# Patient Record
Sex: Female | Born: 1980 | Race: White | Hispanic: No | Marital: Single | Smoking: Current every day smoker
Health system: Southern US, Community
[De-identification: ages and names within clinical notes are randomized; demographics above are authoritative.]

## PROBLEM LIST (undated history)

## (undated) DIAGNOSIS — T7840XA Allergy, unspecified, initial encounter: Secondary | ICD-10-CM

## (undated) HISTORY — DX: Allergy, unspecified, initial encounter: T78.40XA

---
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
## 2005-04-21 ENCOUNTER — Inpatient Hospital Stay (HOSPITAL_COMMUNITY): Admission: EM | Admit: 2005-04-21 | Discharge: 2005-04-23 | Payer: Self-pay | Admitting: Psychiatry

## 2005-04-21 ENCOUNTER — Ambulatory Visit: Payer: Self-pay | Admitting: Psychiatry

## 2005-04-26 ENCOUNTER — Emergency Department (HOSPITAL_COMMUNITY): Admission: EM | Admit: 2005-04-26 | Discharge: 2005-04-26 | Payer: Self-pay | Admitting: Emergency Medicine

## 2005-07-23 ENCOUNTER — Emergency Department (HOSPITAL_COMMUNITY): Admission: EM | Admit: 2005-07-23 | Discharge: 2005-07-23 | Payer: Self-pay | Admitting: Emergency Medicine

## 2005-12-18 ENCOUNTER — Emergency Department (HOSPITAL_COMMUNITY): Admission: EM | Admit: 2005-12-18 | Discharge: 2005-12-18 | Payer: Self-pay | Admitting: Emergency Medicine

## 2005-12-23 ENCOUNTER — Emergency Department (HOSPITAL_COMMUNITY): Admission: EM | Admit: 2005-12-23 | Discharge: 2005-12-23 | Payer: Self-pay | Admitting: Emergency Medicine

## 2015-11-13 ENCOUNTER — Ambulatory Visit (INDEPENDENT_AMBULATORY_CARE_PROVIDER_SITE_OTHER): Payer: BLUE CROSS/BLUE SHIELD | Admitting: Physician Assistant

## 2015-11-13 VITALS — BP 116/82 | HR 112 | Temp 98.1°F | Resp 16 | Ht 68.0 in | Wt 169.2 lb

## 2015-11-13 DIAGNOSIS — M7582 Other shoulder lesions, left shoulder: Secondary | ICD-10-CM | POA: Diagnosis not present

## 2015-11-13 DIAGNOSIS — M25512 Pain in left shoulder: Secondary | ICD-10-CM

## 2015-11-13 MED ORDER — NAPROXEN 500 MG PO TABS: 500.0000 mg | ORAL_TABLET | Freq: Two times a day (BID) | ORAL | Status: DC

## 2015-11-13 NOTE — Progress Notes (Signed)
   11/14/2015 8:56 PM   DOB: 02/27/1981 / MRN: [id number]  SUBJECTIVE:  Janese BanksCarly J Jessee is a 35 y.o. female presenting for left shoulder pain.  Reports the pain has been present for two years now but has suddenly worsened over the last two days.  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 Reports most of her pain occurs with the first 15 degrees of abduction, and she pain free throughout the remainder of abduction.  She denies fever, chest pain, SOB, DOE, nausea.   She has No Known Allergies.   She  has a past medical history of Allergy.    She  reports that she has been smoking.  She does not have any smokeless tobacco history on file. She reports that she does not drink alcohol or use illicit drugs. She  has no sexual activity history on file. The patient  has no past surgical history on file.  Her family history is not on file.  ROS  Per HPI.   Problem list and medications reviewed and updated by myself where necessary, and exist elsewhere in the encounter.   OBJECTIVE:  BP 116/82 mmHg  Pulse 112  Temp(Src) 98.1 F (36.7 C) (Oral)  Resp 16  Ht 5\' 8"  (1.727 m)  Wt 169 lb 3.2 oz (76.749 kg)  BMI 25.73 kg/m2  SpO2 98%  LMP 11/01/2015  Physical Exam  Constitutional: She is oriented to person, place, and time. She appears well-developed.  Cardiovascular: Regular rhythm, normal heart sounds and intact distal pulses.  Exam reveals no gallop and no friction rub.   No murmur heard. Pulmonary/Chest: Effort normal and breath sounds normal. No respiratory distress. She has no wheezes. She has no rales. She exhibits no tenderness.  Musculoskeletal:       Right shoulder: Normal.       Left shoulder: She exhibits tenderness (along the teres minor), crepitus and pain. She exhibits normal range of motion, no bony tenderness, no swelling, no effusion, no deformity, no spasm and normal strength.       Arms: Neurological: She is alert and oriented to  person, place, and time.  Skin: Skin is warm and dry. She is not diaphoretic.    No results found for this or any previous visit (from the past 72 hour(s)).  No results found.  ASSESSMENT AND PLAN  Nyomie was seen today for shoulder pain and flu vaccine.  Diagnoses and all orders for this visit:  Left shoulder pain: She has the perfect presentation for impingement given her exam and the nature of her work.  Advised she wear a sling at work and have given a note outlining this.  Will get her in with orthopedics. Starting an NSAID.   -     Sling immobilizer -     naproxen (NAPROSYN) 500 MG tablet; Take 1 tablet (500 mg total) by mouth 2 (two) times daily with a meal.  Rotator cuff tendinitis, left -     Ambulatory referral to Orthopedic Surgery    The patient was advised to call or return to clinic if she does not see an improvement in symptoms or to seek the care of the closest emergency department if she worsens with the above plan.   Deliah BostonMichael Clark, MHS, PA-C [facility] 11/14/2015 8:56 PM

## 2015-11-13 NOTE — Patient Instructions (Signed)
     IF you received an x-ray today, you will receive an invoice from [facility]. Please contact [facility] at [phone] with questions or concerns regarding your invoice.   IF you received labwork today, you will receive an invoice from [facility]. Please contact [facility] at [phone] with questions or concerns regarding your invoice.   Our billing staff will not be able to assist you with questions regarding bills from these companies.  You will be contacted with the lab results as soon as they are available. The fastest way to get your results is to activate your My Chart account. Instructions are located on the last page of this paperwork. If you have not heard from us regarding the results in 2 weeks, please contact this office.      

## 2019-04-07 ENCOUNTER — Emergency Department (HOSPITAL_COMMUNITY): Payer: BLUE CROSS/BLUE SHIELD

## 2019-04-07 ENCOUNTER — Inpatient Hospital Stay (HOSPITAL_COMMUNITY)
Admission: EM | Admit: 2019-04-07 | Discharge: 2019-04-09 | DRG: 100 | Disposition: A | Discharge status: Home / Self Care | Payer: BLUE CROSS/BLUE SHIELD | Attending: Internal Medicine | Admitting: Internal Medicine

## 2019-04-07 ENCOUNTER — Observation Stay (HOSPITAL_COMMUNITY): Payer: BLUE CROSS/BLUE SHIELD

## 2019-04-07 ENCOUNTER — Other Ambulatory Visit: Payer: Self-pay

## 2019-04-07 DIAGNOSIS — F909 Attention-deficit hyperactivity disorder, unspecified type: Secondary | ICD-10-CM | POA: Diagnosis present

## 2019-04-07 DIAGNOSIS — F411 Generalized anxiety disorder: Secondary | ICD-10-CM | POA: Diagnosis present

## 2019-04-07 DIAGNOSIS — Z716 Tobacco abuse counseling: Secondary | ICD-10-CM

## 2019-04-07 DIAGNOSIS — S022XXA Fracture of nasal bones, initial encounter for closed fracture: Secondary | ICD-10-CM | POA: Diagnosis present

## 2019-04-07 DIAGNOSIS — F17210 Nicotine dependence, cigarettes, uncomplicated: Secondary | ICD-10-CM | POA: Diagnosis present

## 2019-04-07 DIAGNOSIS — G40509 Epileptic seizures related to external causes, not intractable, without status epilepticus: Secondary | ICD-10-CM | POA: Diagnosis not present

## 2019-04-07 DIAGNOSIS — G40909 Epilepsy, unspecified, not intractable, without status epilepticus: Secondary | ICD-10-CM

## 2019-04-07 DIAGNOSIS — Z79899 Other long term (current) drug therapy: Secondary | ICD-10-CM

## 2019-04-07 DIAGNOSIS — Y92511 Restaurant or cafe as the place of occurrence of the external cause: Secondary | ICD-10-CM

## 2019-04-07 DIAGNOSIS — G9341 Metabolic encephalopathy: Secondary | ICD-10-CM | POA: Diagnosis present

## 2019-04-07 DIAGNOSIS — Y990 Civilian activity done for income or pay: Secondary | ICD-10-CM

## 2019-04-07 DIAGNOSIS — F10188 Alcohol abuse with other alcohol-induced disorder: Secondary | ICD-10-CM | POA: Diagnosis present

## 2019-04-07 DIAGNOSIS — R030 Elevated blood-pressure reading, without diagnosis of hypertension: Secondary | ICD-10-CM | POA: Diagnosis present

## 2019-04-07 DIAGNOSIS — R569 Unspecified convulsions: Secondary | ICD-10-CM | POA: Diagnosis not present

## 2019-04-07 DIAGNOSIS — E871 Hypo-osmolality and hyponatremia: Secondary | ICD-10-CM | POA: Diagnosis present

## 2019-04-07 DIAGNOSIS — W1830XA Fall on same level, unspecified, initial encounter: Secondary | ICD-10-CM | POA: Diagnosis present

## 2019-04-07 DIAGNOSIS — Z20828 Contact with and (suspected) exposure to other viral communicable diseases: Secondary | ICD-10-CM | POA: Diagnosis present

## 2019-04-07 DIAGNOSIS — F329 Major depressive disorder, single episode, unspecified: Secondary | ICD-10-CM | POA: Diagnosis present

## 2019-04-07 LAB — CBC WITH DIFFERENTIAL/PLATELET
Abs Immature Granulocytes: 0.04 10*3/uL (ref 0.00–0.07)
Basophils Absolute: 0.1 10*3/uL (ref 0.0–0.1)
Basophils Relative: 1 %
Eosinophils Absolute: 0.2 10*3/uL (ref 0.0–0.5)
Eosinophils Relative: 2 %
HCT: 42.5 % (ref 36.0–46.0)
Hemoglobin: 14.7 g/dL (ref 12.0–15.0)
Immature Granulocytes: 1 %
Lymphocytes Relative: 18 %
Lymphs Abs: 1.4 10*3/uL (ref 0.7–4.0)
MCH: 32.2 pg (ref 26.0–34.0)
MCHC: 34.6 g/dL (ref 30.0–36.0)
MCV: 93 fL (ref 80.0–100.0)
Monocytes Absolute: 0.5 10*3/uL (ref 0.1–1.0)
Monocytes Relative: 7 %
Neutro Abs: 5.7 10*3/uL (ref 1.7–7.7)
Neutrophils Relative %: 71 %
Platelets: 177 10*3/uL (ref 150–400)
RBC: 4.57 MIL/uL (ref 3.87–5.11)
RDW: 12.7 % (ref 11.5–15.5)
WBC: 7.8 10*3/uL (ref 4.0–10.5)
nRBC: 0 % (ref 0.0–0.2)

## 2019-04-07 LAB — BASIC METABOLIC PANEL
Anion gap: 14 (ref 5–15)
BUN: 5 mg/dL — ABNORMAL LOW (ref 6–20)
CO2: 21 mmol/L — ABNORMAL LOW (ref 22–32)
Calcium: 9.2 mg/dL (ref 8.9–10.3)
Chloride: 99 mmol/L (ref 98–111)
Creatinine, Ser: 0.67 mg/dL (ref 0.44–1.00)
GFR calc Af Amer: >60 mL/min (ref >60)
GFR calc non Af Amer: >60 mL/min (ref >60)
Glucose, Bld: 127 mg/dL — ABNORMAL HIGH (ref 70–99)
Potassium: 3.5 mmol/L (ref 3.5–5.1)
Sodium: 134 mmol/L — ABNORMAL LOW (ref 135–145)

## 2019-04-07 LAB — I-STAT BETA HCG BLOOD, ED (MC, WL, AP ONLY): I-stat hCG, quantitative: <5 m[IU]/mL (ref <5)

## 2019-04-07 LAB — MAGNESIUM: Magnesium: 1.7 mg/dL (ref 1.7–2.4)

## 2019-04-07 MED ORDER — ACETAMINOPHEN 325 MG PO TABS
650.0000 mg | ORAL_TABLET | Freq: Four times a day (QID) | ORAL | Status: DC | PRN
  Administered 2019-04-07 – 2019-04-08 (×3): 650 mg via ORAL
  Filled 2019-04-07 (×3): qty 2

## 2019-04-07 MED ORDER — LEVETIRACETAM 500 MG PO TABS: 500.0000 mg | ORAL_TABLET | Freq: Two times a day (BID) | ORAL | Status: DC

## 2019-04-07 MED ORDER — NICOTINE 21 MG/24HR TD PT24
21.0000 mg | MEDICATED_PATCH | Freq: Every day | TRANSDERMAL | Status: DC
  Filled 2019-04-07: qty 1

## 2019-04-07 MED ORDER — LEVETIRACETAM IN NACL 1000 MG/100ML IV SOLN
1000.0000 mg | Freq: Once | INTRAVENOUS | Status: AC
  Administered 2019-04-07: 20:00:00 1000 mg via INTRAVENOUS
  Filled 2019-04-07: qty 100

## 2019-04-08 ENCOUNTER — Inpatient Hospital Stay (HOSPITAL_COMMUNITY): Payer: BLUE CROSS/BLUE SHIELD

## 2019-04-08 ENCOUNTER — Observation Stay (HOSPITAL_COMMUNITY): Payer: BLUE CROSS/BLUE SHIELD

## 2019-04-08 DIAGNOSIS — R030 Elevated blood-pressure reading, without diagnosis of hypertension: Secondary | ICD-10-CM | POA: Diagnosis present

## 2019-04-08 DIAGNOSIS — W1830XA Fall on same level, unspecified, initial encounter: Secondary | ICD-10-CM | POA: Diagnosis present

## 2019-04-08 DIAGNOSIS — S022XXA Fracture of nasal bones, initial encounter for closed fracture: Secondary | ICD-10-CM | POA: Diagnosis present

## 2019-04-08 DIAGNOSIS — G9341 Metabolic encephalopathy: Secondary | ICD-10-CM | POA: Diagnosis present

## 2019-04-08 DIAGNOSIS — F329 Major depressive disorder, single episode, unspecified: Secondary | ICD-10-CM | POA: Diagnosis present

## 2019-04-08 DIAGNOSIS — F17210 Nicotine dependence, cigarettes, uncomplicated: Secondary | ICD-10-CM | POA: Diagnosis present

## 2019-04-08 DIAGNOSIS — G40509 Epileptic seizures related to external causes, not intractable, without status epilepticus: Secondary | ICD-10-CM | POA: Diagnosis present

## 2019-04-08 DIAGNOSIS — R569 Unspecified convulsions: Secondary | ICD-10-CM | POA: Diagnosis present

## 2019-04-08 DIAGNOSIS — Z79899 Other long term (current) drug therapy: Secondary | ICD-10-CM | POA: Diagnosis not present

## 2019-04-08 DIAGNOSIS — Y990 Civilian activity done for income or pay: Secondary | ICD-10-CM | POA: Diagnosis not present

## 2019-04-08 DIAGNOSIS — F1010 Alcohol abuse, uncomplicated: Secondary | ICD-10-CM | POA: Diagnosis not present

## 2019-04-08 DIAGNOSIS — F10188 Alcohol abuse with other alcohol-induced disorder: Secondary | ICD-10-CM | POA: Diagnosis present

## 2019-04-08 DIAGNOSIS — Y92511 Restaurant or cafe as the place of occurrence of the external cause: Secondary | ICD-10-CM | POA: Diagnosis not present

## 2019-04-08 DIAGNOSIS — Z716 Tobacco abuse counseling: Secondary | ICD-10-CM | POA: Diagnosis not present

## 2019-04-08 DIAGNOSIS — Z20828 Contact with and (suspected) exposure to other viral communicable diseases: Secondary | ICD-10-CM | POA: Diagnosis present

## 2019-04-08 DIAGNOSIS — F909 Attention-deficit hyperactivity disorder, unspecified type: Secondary | ICD-10-CM | POA: Diagnosis present

## 2019-04-08 DIAGNOSIS — G40909 Epilepsy, unspecified, not intractable, without status epilepticus: Secondary | ICD-10-CM | POA: Diagnosis not present

## 2019-04-08 DIAGNOSIS — F411 Generalized anxiety disorder: Secondary | ICD-10-CM | POA: Diagnosis present

## 2019-04-08 DIAGNOSIS — E871 Hypo-osmolality and hyponatremia: Secondary | ICD-10-CM | POA: Diagnosis present

## 2019-04-08 LAB — COMPREHENSIVE METABOLIC PANEL WITH GFR
ALT: 49 U/L — ABNORMAL HIGH (ref 0–44)
AST: 54 U/L — ABNORMAL HIGH (ref 15–41)
Albumin: 3.9 g/dL (ref 3.5–5.0)
Alkaline Phosphatase: 62 U/L (ref 38–126)
Anion gap: 13 (ref 5–15)
BUN: 5 mg/dL — ABNORMAL LOW (ref 6–20)
CO2: 23 mmol/L (ref 22–32)
Calcium: 9.1 mg/dL (ref 8.9–10.3)
Chloride: 101 mmol/L (ref 98–111)
Creatinine, Ser: 0.53 mg/dL (ref 0.44–1.00)
GFR calc Af Amer: >60 mL/min
GFR calc non Af Amer: >60 mL/min
Glucose, Bld: 102 mg/dL — ABNORMAL HIGH (ref 70–99)
Potassium: 3.2 mmol/L — ABNORMAL LOW (ref 3.5–5.1)
Sodium: 137 mmol/L (ref 135–145)
Total Bilirubin: 0.6 mg/dL (ref 0.3–1.2)
Total Protein: 7.4 g/dL (ref 6.5–8.1)

## 2019-04-08 LAB — SARS CORONAVIRUS 2 BY RT PCR (HOSPITAL ORDER, PERFORMED IN ~~LOC~~ HOSPITAL LAB): SARS Coronavirus 2: NEGATIVE

## 2019-04-08 LAB — CBC
HCT: 41 % (ref 36.0–46.0)
Hemoglobin: 13.8 g/dL (ref 12.0–15.0)
MCH: 31.9 pg (ref 26.0–34.0)
MCHC: 33.7 g/dL (ref 30.0–36.0)
MCV: 94.7 fL (ref 80.0–100.0)
Platelets: 188 10*3/uL (ref 150–400)
RBC: 4.33 MIL/uL (ref 3.87–5.11)
RDW: 12.8 % (ref 11.5–15.5)
WBC: 9.7 10*3/uL (ref 4.0–10.5)
nRBC: 0 % (ref 0.0–0.2)

## 2019-04-08 LAB — HIV ANTIBODY (ROUTINE TESTING W REFLEX): HIV Screen 4th Generation wRfx: NONREACTIVE

## 2019-04-08 MED ORDER — ONDANSETRON HCL 4 MG PO TABS: 4.0000 mg | ORAL_TABLET | Freq: Four times a day (QID) | ORAL | Status: DC | PRN

## 2019-04-08 MED ORDER — ONDANSETRON HCL 4 MG/2ML IJ SOLN: 4.0000 mg | Freq: Four times a day (QID) | INTRAMUSCULAR | Status: DC | PRN

## 2019-04-08 MED ORDER — VITAMIN B-1 100 MG PO TABS
100.0000 mg | ORAL_TABLET | Freq: Every day | ORAL | Status: DC
  Administered 2019-04-08 – 2019-04-09 (×2): 100 mg via ORAL
  Filled 2019-04-08 (×2): qty 1

## 2019-04-08 MED ORDER — ALPRAZOLAM 0.5 MG PO TABS
1.0000 mg | ORAL_TABLET | Freq: Three times a day (TID) | ORAL | Status: DC | PRN
  Administered 2019-04-09: 02:00:00 1 mg via ORAL
  Filled 2019-04-08: qty 2

## 2019-04-08 MED ORDER — LORAZEPAM 2 MG/ML IJ SOLN
2.0000 mg | Freq: Four times a day (QID) | INTRAMUSCULAR | Status: DC
  Administered 2019-04-08 (×2): 2 mg via INTRAVENOUS
  Filled 2019-04-08 (×2): qty 1

## 2019-04-08 MED ORDER — TAB-A-VITE/IRON PO TABS
1.0000 | ORAL_TABLET | Freq: Every day | ORAL | Status: DC
  Administered 2019-04-08 – 2019-04-09 (×2): 1 via ORAL
  Filled 2019-04-08 (×3): qty 1

## 2019-04-08 MED ORDER — DEXTROSE IN LACTATED RINGERS 5 % IV SOLN
INTRAVENOUS | Status: DC
  Administered 2019-04-08: 15:00:00 via INTRAVENOUS

## 2019-04-08 MED ORDER — GADOBUTROL 1 MMOL/ML IV SOLN
7.5000 mL | Freq: Once | INTRAVENOUS | Status: AC | PRN
  Administered 2019-04-08: 01:00:00 7.5 mL via INTRAVENOUS

## 2019-04-08 MED ORDER — LORAZEPAM 1 MG PO TABS
1.0000 mg | ORAL_TABLET | ORAL | Status: DC | PRN
  Administered 2019-04-08: 1 mg via ORAL
  Filled 2019-04-08: qty 1

## 2019-04-08 MED ORDER — SODIUM CHLORIDE 0.9 % IV SOLN
INTRAVENOUS | Status: DC
  Administered 2019-04-08: 02:00:00 via INTRAVENOUS

## 2019-04-08 MED ORDER — SERTRALINE HCL 100 MG PO TABS
100.0000 mg | ORAL_TABLET | Freq: Every day | ORAL | Status: DC
  Administered 2019-04-08 – 2019-04-09 (×2): 100 mg via ORAL
  Filled 2019-04-08 (×2): qty 1

## 2019-04-08 NOTE — Progress Notes (Signed)
Received patient from ED, AOX4, ambulatory, VSS but slightly elevated, pain at 3/10 d/t headache, oriented to room, bed controls, cal light, placed on telemetry monitor box 6N01, seizure precaution, SCDs and explained plan of care.  NT gave ice water to drink, pt now resting on bed watching TV.  Will monitor.

## 2019-04-08 NOTE — Progress Notes (Addendum)
NEURO HOSPITALIST PROGRESS NOTE   Subjective: Patient states that she feels better than yesterday. No reports of any further seizure activity. MRI was normal. Pending EEG.   Exam: Vitals:   04/08/19 0127 04/08/19 0547  BP: (!) 148/96 (!) 145/91  Pulse: 98 82  Resp: 16 18  Temp: 98.3 F (36.8 C) 98.4 F (36.9 C)  SpO2: 97% 98%    Physical Exam   HEENT-  Normocephalic, no lesions, without obvious abnormality.  Normal external eye and conjunctiva.   Lungs- no excessive working breathing.  Saturations within normal limits Extremities- Warm, dry and intact Musculoskeletal-no joint tenderness, deformity or swelling Skin-warm and dry, no hyperpigmentation, vitiligo, or suspicious lesions   Neuro:  Mental Status: Alert, oriented, thought content appropriate.  Speech fluent without evidence of aphasia.  Able to follow 3 step commands without difficulty. Cranial Nerves: II:  Visual fields grossly normal,  III,IV, VI: ptosis not present, extra-ocular motions intact bilaterally pupils equal, round, reactive to light and accommodation V,VII: smile symmetric, facial light touch sensation normal bilaterally VIII: hearing normal bilaterally IX,X: uvula rises symmetrically XI: bilateral shoulder shrug XII: midline tongue extension Motor: Right : Upper extremity   5/5    Left:     Upper extremity   5/5  Lower extremity   5/5     Lower extremity   5/5 Tone and bulk:normal tone throughout; no atrophy noted Sensory:  light touch intact throughout, bilaterally Deep Tendon Reflexes: 2+ right biceps biceps, patella 3+ left biceps and patella Plantars: Right: downgoing   Left: downgoing Cerebellar: normal finger-to-nose, normal rapid alternating movements and normal heel-to-shin test Gait: deferred    Medications:  Scheduled: . LORazepam  2 mg Intravenous Q6H  . nicotine  21 mg Transdermal Daily   Continuous: . sodium chloride 75 mL/hr at 04/08/19 7510     Pertinent Labs/Diagnostics:   Ct Head Wo Contrast  Result Date: 04/08/2019 CLINICAL DATA:  Seizure, fall, nose bleed. EXAM: CT HEAD WITHOUT CONTRAST CT MAXILLOFACIAL WITHOUT CONTRAST CT CERVICAL SPINE WITHOUT CONTRAST TECHNIQUE: Multidetector CT imaging of the head, cervical spine, and maxillofacial structures were performed using the standard protocol without intravenous contrast. Multiplanar CT image reconstructions of the cervical spine and maxillofacial structures were also generated. COMPARISON:  None. FINDINGS: CT HEAD FINDINGS Brain: Ventricles are normal in size and configuration. All areas of the brain demonstrate appropriate gray-white matter attenuation. There is no mass, hemorrhage, edema or other evidence of acute parenchymal abnormality. No extra-axial hemorrhage. Vascular: No hyperdense vessel or unexpected calcification. Skull: Normal. Negative for fracture or focal lesion. Other: None. CT MAXILLOFACIAL FINDINGS Osseous: Lower frontal bones are intact. Nondisplaced, to minimally displaced, fractures of the nasal bones bilaterally. Osseous structures about the orbits are intact and normally aligned bilaterally. Bilateral zygomatic arches and pterygoid plates are intact and normally aligned. Walls of the maxillary sinuses appear intact and normally aligned. No mandible fracture or displacement seen. Defect within the posterior LEFT mandibular molar, presumably a cavity. Orbits: Negative. No traumatic or inflammatory finding. Sinuses: Clear. Soft tissues: Soft tissue edema overlying the nasal bones. No circumscribed soft tissue hematoma identified. CT CERVICAL SPINE FINDINGS Alignment: Minimal scoliosis which may be accentuated by patient positioning. Slight reversal of the normal cervical spine lordosis is likely related to patient positioning or muscle spasm. No evidence of acute vertebral body subluxation. Skull base and vertebrae: No fracture line or displaced  fracture fragment seen.  Facet joints are normally aligned. Soft tissues and spinal canal: No prevertebral fluid or swelling. No visible canal hematoma. Disc levels: Mild disc desiccations within the mid and lower cervical spine. No significant central canal stenosis at any level. Upper chest: Negative. Other: None. IMPRESSION: 1. No acute intracranial abnormality. No intracranial mass, hemorrhage or edema. No skull fracture. 2. Nondisplaced, perhaps minimally displaced, fractures of the nasal bones bilaterally. Soft tissue edema overlying the nasal bones. No other facial bone fracture or dislocation seen. 3. No fracture or acute subluxation within the cervical spine. Mild degenerative change within the mid and lower cervical spine. 4. Defect within the posterior LEFT mandibular molar, presumably a large cavity. Electronically Signed   By: Bary RichardStan  Maynard M.D.   On: 02/12/2019 20:30   Ct Cervical Spine Wo Contrast  Result Date: 01/03/2019 CLINICAL DATA:  Seizure, fall, nose bleed. EXAM: CT HEAD WITHOUT CONTRAST CT MAXILLOFACIAL WITHOUT CONTRAST CT CERVICAL SPINE WITHOUT CONTRAST TECHNIQUE: Multidetector CT imaging of the head, cervical spine, and maxillofacial structures were performed using the standard protocol without intravenous contrast. Multiplanar CT image reconstructions of the cervical spine and maxillofacial structures were also generated. COMPARISON:  None. FINDINGS: CT HEAD FINDINGS Brain: Ventricles are normal in size and configuration. All areas of the brain demonstrate appropriate gray-white matter attenuation. There is no mass, hemorrhage, edema or other evidence of acute parenchymal abnormality. No extra-axial hemorrhage. Vascular: No hyperdense vessel or unexpected calcification. Skull: Normal. Negative for fracture or focal lesion. Other: None. CT MAXILLOFACIAL FINDINGS Osseous: Lower frontal bones are intact. Nondisplaced, to minimally displaced, fractures of the nasal bones bilaterally. Osseous structures about the  orbits are intact and normally aligned bilaterally. Bilateral zygomatic arches and pterygoid plates are intact and normally aligned. Walls of the maxillary sinuses appear intact and normally aligned. No mandible fracture or displacement seen. Defect within the posterior LEFT mandibular molar, presumably a cavity. Orbits: Negative. No traumatic or inflammatory finding. Sinuses: Clear. Soft tissues: Soft tissue edema overlying the nasal bones. No circumscribed soft tissue hematoma identified. CT CERVICAL SPINE FINDINGS Alignment: Minimal scoliosis which may be accentuated by patient positioning. Slight reversal of the normal cervical spine lordosis is likely related to patient positioning or muscle spasm. No evidence of acute vertebral body subluxation. Skull base and vertebrae: No fracture line or displaced fracture fragment seen. Facet joints are normally aligned. Soft tissues and spinal canal: No prevertebral fluid or swelling. No visible canal hematoma. Disc levels: Mild disc desiccations within the mid and lower cervical spine. No significant central canal stenosis at any level. Upper chest: Negative. Other: None. IMPRESSION: 1. No acute intracranial abnormality. No intracranial mass, hemorrhage or edema. No skull fracture. 2. Nondisplaced, perhaps minimally displaced, fractures of the nasal bones bilaterally. Soft tissue edema overlying the nasal bones. No other facial bone fracture or dislocation seen. 3. No fracture or acute subluxation within the cervical spine. Mild degenerative change within the mid and lower cervical spine. 4. Defect within the posterior LEFT mandibular molar, presumably a large cavity. Electronically Signed   By: Bary RichardStan  Maynard M.D.   On: 02/12/2019 20:30   Mr Laqueta JeanBrain W And Wo Contrast  Result Date: 04/08/2019 CLINICAL DATA:  Seizure.  Unequal pupils. EXAM: MRI HEAD WITHOUT AND WITH CONTRAST TECHNIQUE: Multiplanar, multiecho pulse sequences of the brain and surrounding structures were  obtained without and with intravenous contrast. CONTRAST:  7.5 mL Gadavist COMPARISON:  None. FINDINGS: BRAIN: There is no acute infarct, acute hemorrhage or extra-axial collection. The white  matter signal is normal for the patient's age. The cerebral and cerebellar volume are age-appropriate. There is no hydrocephalus. The midline structures are normal. The hippocampi are normal and symmetric in size and signal. The hypothalamus and mamillary bodies are normal. There is no cortical ectopia or dysplasia. No abnormal contrast enhancement. VASCULAR: Susceptibility-sensitive sequences show no chronic microhemorrhage or superficial siderosis. The major intracranial arterial and venous sinus flow voids are normal. SKULL AND UPPER CERVICAL SPINE: Calvarial bone marrow signal is normal. There is no skull base mass. The visualized upper cervical spine and soft tissues are normal. SINUSES/ORBITS: There are no fluid levels or advanced mucosal thickening. The mastoid air cells and middle ear cavities are free of fluid. The orbits are normal. IMPRESSION: Normal brain MRI. Electronically Signed   By: Deatra RobinsonKevin  Herman M.D.   On: 04/08/2019 01:27   Ct Maxillofacial Wo Cm  Result Date: 04/17/2019 CLINICAL DATA:  Seizure, fall, nose bleed. EXAM: CT HEAD WITHOUT CONTRAST CT MAXILLOFACIAL WITHOUT CONTRAST CT CERVICAL SPINE WITHOUT CONTRAST TECHNIQUE: Multidetector CT imaging of the head, cervical spine, and maxillofacial structures were performed using the standard protocol without intravenous contrast. Multiplanar CT image reconstructions of the cervical spine and maxillofacial structures were also generated. COMPARISON:  None. FINDINGS: CT HEAD FINDINGS Brain: Ventricles are normal in size and configuration. All areas of the brain demonstrate appropriate gray-white matter attenuation. There is no mass, hemorrhage, edema or other evidence of acute parenchymal abnormality. No extra-axial hemorrhage. Vascular: No hyperdense vessel or  unexpected calcification. Skull: Normal. Negative for fracture or focal lesion. Other: None. CT MAXILLOFACIAL FINDINGS Osseous: Lower frontal bones are intact. Nondisplaced, to minimally displaced, fractures of the nasal bones bilaterally. Osseous structures about the orbits are intact and normally aligned bilaterally. Bilateral zygomatic arches and pterygoid plates are intact and normally aligned. Walls of the maxillary sinuses appear intact and normally aligned. No mandible fracture or displacement seen. Defect within the posterior LEFT mandibular molar, presumably a cavity. Orbits: Negative. No traumatic or inflammatory finding. Sinuses: Clear. Soft tissues: Soft tissue edema overlying the nasal bones. No circumscribed soft tissue hematoma identified. CT CERVICAL SPINE FINDINGS Alignment: Minimal scoliosis which may be accentuated by patient positioning. Slight reversal of the normal cervical spine lordosis is likely related to patient positioning or muscle spasm. No evidence of acute vertebral body subluxation. Skull base and vertebrae: No fracture line or displaced fracture fragment seen. Facet joints are normally aligned. Soft tissues and spinal canal: No prevertebral fluid or swelling. No visible canal hematoma. Disc levels: Mild disc desiccations within the mid and lower cervical spine. No significant central canal stenosis at any level. Upper chest: Negative. Other: None. IMPRESSION: 1. No acute intracranial abnormality. No intracranial mass, hemorrhage or edema. No skull fracture. 2. Nondisplaced, perhaps minimally displaced, fractures of the nasal bones bilaterally. Soft tissue edema overlying the nasal bones. No other facial bone fracture or dislocation seen. 3. No fracture or acute subluxation within the cervical spine. Mild degenerative change within the mid and lower cervical spine. 4. Defect within the posterior LEFT mandibular molar, presumably a large cavity. Electronically Signed   By: Bary RichardStan   Maynard M.D.   On: 008/24/2020 20:30   Assessment:  38 year old female with seizures x 2. Overall presentation is most consistent with EtOH withdrawal seizure. MRI brain normal. EEG pending    Recommendations:  -- EEG --seizure precautions -- d/c adderrall. D/t this medication lowering the seizure threshold.  Outpatient seizure precautions: [place] [place] [facility] [place] statutes, patients with seizures are  not allowed to drive until  they have been seizure-free for six months. Use caution when using heavy equipment or power tools. Avoid working on ladders or at heights. Take showers instead of baths. Ensure the water temperature is not too high on the home water heater. Do not go swimming alone. When caring for infants or small children, sit down when holding, feeding, or changing them to minimize risk of injury to the child in the event you have a seizure. Also, Maintain good sleep hygiene. Avoid alcohol.   Valentina LucksJessica Williams, MSN, NP-C [facility] [phone]  Attending neurologist's note to follow    04/08/2019, 7:51 AM   Attending Neurohospitalist Addendum Patient seen and examined with APP/Resident. Agree with the history and physical as documented above. Agree with the plan as documented, which I helped formulate. I have independently reviewed the chart, obtained history, review of systems and examined the patient.I have personally reviewed pertinent head/neck/spine imaging (CT/MRI).   Awaiting EEG.  Will update after EEG results are available.  Please feel free to call with any questions. --- Milon DikesAshish Arin Vanosdol, MD [facility] Pager: [phone]  If 7pm to 7am, please call on call as listed on AMION.

## 2019-04-08 NOTE — Progress Notes (Addendum)
PROGRESS NOTE    Janese BanksCarly J Krah  ZOX:[id number] DOB: 07/05/1981 DOA: 04/21/2019 PCP: Patient, No Pcp Per    Brief Narrative:  38 year old female who presented with seizures.  She does have the significant past medical history for allergies, attention deficit hyperactivity disorder and anxiety.  She had a seizure activity for about 30 to 40 seconds, by the time of her arrival to the emergency room she was awake and alert.  Her temperature was 99.2, blood pressure 139/108, pulse rate 120, respiratory 26, oxygen saturation 89% on room air.  She was noted to be very anxious, moist mucous membranes, lungs clear to auscultation bilaterally, heart S1-S2 present and rhythmic, abdomen soft, no lower extremity edema, neurologically patient was nonfocal. Sodium 134, potassium 3.5, chloride 99, bicarb 21, glucose 127, BUN 5, creatinine 0.67, white count 7.8, hemoglobin 14.7, hematocrit 42.5, platelets 177.  SARS COVID-19 was negative.  Head neck and facial CT negative for acute changes except a nondisplaced to minimally displaced fracture of the nasal bones bilaterally.  EKG 106 bpm, normal axis, normal intervals, no ST segment or T wave changes.  Patient was admitted to the hospital with a working diagnosis of uncontrolled seizures.   Assessment & Plan:   Principal Problem:   Seizure disorder (HCC) Active Problems:   Hyponatremia   1. Uncontrolled seizures in the setting of alcohol abuse/ acute metabolic encephalopathy. Patient continue to be somnolent, but easy to arouse, non focal. Continue to be at high risk for recurrent seizures, EEG still pending. Will continue with neuro checks q 4 H, patient has received IV keppra on admission. Will continue hydration with D5LR at 50 ml per H.   2. Alcohol abuse. No clinical sings of active withdrawal, no tremors or anxiety, will hold on scheduled lorazepam, and continue neuro checks q 4 H. Last drink 4 days ago. Will continue lorazepam only as needed, po, for  anxiety. Add oral thiamine and multivitamins.   3. ADHD. Continue to hold stimulants for now.   DVT prophylaxis: enoxaparin   Code Status: full Family Communication: no family at the bed side.  Disposition Plan/ discharge barriers: pending clinical improvement and final neurology recommendation.   Body mass index is 26.68 kg/m. Malnutrition Type:      Malnutrition Characteristics:      Nutrition Interventions:     RN Pressure Injury Documentation:     Consultants:   Neurology  Procedures:     Antimicrobials:       Subjective: Patient is somnolent, but denies any chest pain, dyspnea, nausea or vomiting. No anxiety or tremors.   Objective: Vitals:   04/09/2019 2300 04/08/19 0127 04/08/19 0547 04/08/19 1328  BP: (!) 145/117 (!) 148/96 (!) 145/91 134/84  Pulse: (!) 102 98 82 84  Resp: (!) 21 16 18 18   Temp:  98.3 F (36.8 C) 98.4 F (36.9 C) 98.4 F (36.9 C)  TempSrc:  Oral Oral Oral  SpO2: 96% 97% 98% 98%  Weight:  79.6 kg    Height:  5\' 8"  (1.727 m)      Intake/Output Summary (Last 24 hours) at 04/08/2019 1435 Last data filed at 04/08/2019 0929 Gross per 24 hour  Intake 523.45 ml  Output -  Net 523.45 ml   Filed Weights   03/27/2019 1258 04/08/19 0127  Weight: 77.1 kg 79.6 kg    Examination:   General: Not in pain or dyspnea  Neurology: somnolent, follows commands, easy to arouse.  E ENT: no pallor, no icterus, oral mucosa moist  Cardiovascular: No JVD. S1-S2 present, rhythmic, no gallops, rubs, or murmurs. No lower extremity edema. Pulmonary: vesicular breath sounds bilaterally, adequate air movement, no wheezing, rhonchi or rales. Gastrointestinal. Abdomen with no organomegaly, non tender, no rebound or guarding Skin. No rashes Musculoskeletal: no joint deformities     Data Reviewed: I have personally reviewed following labs and imaging studies  CBC: Recent Labs  Lab Apr 26, 2019 1258 04/08/19 0309  WBC 7.8 9.7  NEUTROABS 5.7  --    HGB 14.7 13.8  HCT 42.5 41.0  MCV 93.0 94.7  PLT 177 161   Basic Metabolic Panel: Recent Labs  Lab April 26, 2019 1258 2019-04-26 1917 04/08/19 0309  NA 134*  --  137  K 3.5  --  3.2*  CL 99  --  101  CO2 21*  --  23  GLUCOSE 127*  --  102*  BUN 5*  --  5*  CREATININE 0.67  --  0.53  CALCIUM 9.2  --  9.1  MG  --  1.7  --    GFR: Estimated Creatinine Clearance: 107.7 mL/min (by C-G formula based on SCr of 0.53 mg/dL). Liver Function Tests: Recent Labs  Lab 04/08/19 0309  AST 54*  ALT 49*  ALKPHOS 62  BILITOT 0.6  PROT 7.4  ALBUMIN 3.9   No results for input(s): LIPASE, AMYLASE in the last 168 hours. No results for input(s): AMMONIA in the last 168 hours. Coagulation Profile: No results for input(s): INR, PROTIME in the last 168 hours. Cardiac Enzymes: No results for input(s): CKTOTAL, CKMB, CKMBINDEX, TROPONINI in the last 168 hours. BNP (last 3 results) No results for input(s): PROBNP in the last 8760 hours. HbA1C: No results for input(s): HGBA1C in the last 72 hours. CBG: No results for input(s): GLUCAP in the last 168 hours. Lipid Profile: No results for input(s): CHOL, HDL, LDLCALC, TRIG, CHOLHDL, LDLDIRECT in the last 72 hours. Thyroid Function Tests: No results for input(s): TSH, T4TOTAL, FREET4, T3FREE, THYROIDAB in the last 72 hours. Anemia Panel: No results for input(s): VITAMINB12, FOLATE, FERRITIN, TIBC, IRON, RETICCTPCT in the last 72 hours.    Radiology Studies: I have reviewed all of the imaging during this hospital visit personally     Scheduled Meds: . LORazepam  2 mg Intravenous Q6H   Continuous Infusions: . sodium chloride 75 mL/hr at 04/08/19 0625     LOS: 0 days         Gerome Apley, MD

## 2019-04-08 NOTE — Progress Notes (Signed)
FOLLOW UP ON TESTING EEG with no seizures or lateralizing signs. No need for AEDs. Seizure precautions. Follow OP neurology  Please call as needed.  -- Amie Portland, MD [facility] Pager: [phone] If 7pm to 7am, please call on call as listed on [facility].    SEIZURE PRECAUTIONS Per [facility] statutes, patients with seizures are not allowed to drive until they have been seizure-free for six months.   Use caution when using heavy equipment or power tools. Avoid working on ladders or at heights. Take showers instead of baths. Ensure the water temperature is not too high on the home water heater. Do not go swimming alone. Do not lock yourself in a room alone (i.e. bathroom). When caring for infants or small children, sit down when holding, feeding, or changing them to minimize risk of injury to the child in the event you have a seizure. Maintain good sleep hygiene. Avoid alcohol.   If patient has another seizure, call 911 and bring them back to the ED if: A. The seizure lasts longer than 5 minutes.  B. The patient doesn't wake shortly after the seizure or has new problems such as difficulty seeing, speaking or moving following the seizure C. The patient was injured during the seizure D. The patient has a temperature over 102 F (39C) E. The patient vomited during the seizure and now is having trouble breathing

## 2019-04-08 NOTE — Procedures (Signed)
ELECTROENCEPHALOGRAM REPORT   Patient: Samantha Wolf       Room #: 6N31C EEG No. ID: [id number] Age: 38 y.o.        Sex: female Referring Physician: Arrien Report Date:  04/08/2019        Interpreting Physician: Alexis Goodell  History: Samantha Wolf is an 38 y.o. female with new onset seizures  Medications:  MVI  Conditions of Recording:  This is a 21 channel routine scalp EEG performed with bipolar and monopolar montages arranged in accordance to the international 10/20 system of electrode placement. One channel was dedicated to EKG recording.  The patient is in the awake state.  Description:  Artifact is prominent during the recording often obscuring the background rhythm. When able to be visualized the waking background activity consists of a low voltage, symmetrical, fairly well organized, 11 Hz alpha activity, seen from the parieto-occipital and posterior temporal regions.  Low voltage fast activity, poorly organized, is seen anteriorly and is at times superimposed on more posterior regions.  A mixture of theta and alpha rhythms are seen from the central and temporal regions. The patient does not drowse or sleep. Hyperventilation and intermittent photic stimulation were not performed.   IMPRESSION: This is a technically difficult recording secondary to the predominance of muscle and movement artifact.  No epileptiform activity is noted.     Alexis Goodell, MD Neurology [phone] 04/08/2019, 6:01 PM

## 2019-04-08 NOTE — Progress Notes (Signed)
EEG complete - results pending 

## 2019-04-09 ENCOUNTER — Encounter (HOSPITAL_COMMUNITY): Payer: Self-pay

## 2019-04-09 NOTE — Plan of Care (Signed)
  Problem: Education: Goal: Expressions of having a comfortable level of knowledge regarding the disease process will increase Outcome: Progressing   

## 2019-04-09 NOTE — Discharge Summary (Addendum)
Physician Discharge Summary  Samantha Wolf LKG:[id number] DOB: May 08, 1981 DOA: 05/05/19  PCP: Patient, No Pcp Per  Admit date: 05-May-2019 Discharge date: 04/09/2019  Admitted From: Home  Disposition: Home   Recommendations for Outpatient Follow-up and new medication changes:  1. Follow up with Primary care in 7 days.  2. Follow up with neurology in 7 days. 3. Seizure precautions. 4.   Please taper Adderall slowly off as outpatient per neurology recommendations.     Per [facility] statutes, patients with seizures are not allowed to drive until  they have been seizure-free for six months. Use caution when using heavy equipment or power tools. Avoid working on ladders or at heights. Take showers instead of baths. Ensure the water temperature is not too high on the home water heater. Do not go swimming alone. When caring for infants or small children, sit down when holding, feeding, or changing them to minimize risk of injury to the child in the event you have a seizure. Also, Maintain good sleep hygiene. Avoid alcohol  If patienthas another seizure, call 911 and bring them back to the ED if: A. The seizure lasts longer than 5 minutes.  B. The patient doesn't wake shortly after the seizure or has new problems such as difficulty seeing, speaking or moving following the seizure C. The patient was injured during the seizure D. The patient has a temperature over 102 F (39C) E. The patient vomited during the seizure and now is having trouble breathing   Home Health: no   Equipment/Devices: no    Discharge Condition: stable  CODE STATUS: full  Diet recommendation: regular.   Brief/Interim Summary: 38 year old female who presented with seizures.  She does have the significant past medical history for allergies, attention deficit hyperactivity disorder and anxiety.  She had a seizure activity for about 30 to 40 seconds, by the time of her arrival to the emergency room she was  awake and alert.  Her temperature was 99.2, blood pressure 139/108, pulse rate 120, respiratory rate 26, oxygen saturation 89% on room air.  She was noted to be very anxious, moist mucous membranes, lungs clear to auscultation bilaterally, heart S1-S2 present and rhythmic, abdomen soft, no lower extremity edema, neurologically patient was nonfocal. Sodium 134, potassium 3.5, chloride 99, bicarb 21, glucose 127, BUN 5, creatinine 0.67, white count 7.8, hemoglobin 14.7, hematocrit 42.5, platelets 177.  SARS COVID-19 was negative.  Head neck and facial CT negative for acute changes except a nondisplaced to minimally displaced fracture of the nasal bones bilaterally.  EKG 106 bpm, normal axis, normal intervals, no ST segment or T wave changes.  Patient was admitted to the hospital with a working diagnosis of uncontrolled seizures  1.  Seizures in the setting of alcohol abuse, acute metabolic encephalopathy.  Patient was admitted to the medical ward, she had frequent neuro checks and further work-up with electroencephalography.  She was loaded on IV Keppra on admission.  EEG showed no epileptiform activity.  Neurology recommended to continue seizure precautions and outpatient follow-up with neurology.  No further indication for antiepileptic drugs.  2.  Alcohol abuse.  No signs of acute alcohol withdrawal, patient was advised about avoiding alcohol consumption.  3.  Attention deficit hyperactivity disorder.  Central nervous system stimulants were held during her hospitalization. Recommendation to slowly taper off Adderall to prevent further seizures.   4.  Depression/anxiety.  Continue sertraline and as needed alprazolam.  Discharge Diagnoses:  Principal Problem:   Seizure disorder (HCC) Active  Problems:   Hyponatremia   Metabolic encephalopathy    Discharge Instructions  Discharge Instructions    Ambulatory referral to Neurology   Complete by: As directed    An appointment is requested in  approximately: 1 week     Allergies as of 04/09/2019   No Known Allergies     Medication List    STOP taking these medications   naproxen 500 MG tablet Commonly known as: Naprosyn     TAKE these medications   acetaminophen 500 MG tablet Commonly known as: TYLENOL Take 1,000 mg by mouth every 6 (six) hours as needed for mild pain or headache.   Adderall XR 20 MG 24 hr capsule Generic drug: amphetamine-dextroamphetamine Take 20 mg by mouth every morning.   ALPRAZolam 1 MG tablet Commonly known as: XANAX Take 1 mg by mouth 3 (three) times daily as needed for anxiety.   sertraline 100 MG tablet Commonly known as: ZOLOFT Take 200 mg by mouth daily.       No Known Allergies  Consultations:  Neurology    Procedures/Studies: Ct Head Wo Contrast  Result Date: 05-06-19 CLINICAL DATA:  Seizure, fall, nose bleed. EXAM: CT HEAD WITHOUT CONTRAST CT MAXILLOFACIAL WITHOUT CONTRAST CT CERVICAL SPINE WITHOUT CONTRAST TECHNIQUE: Multidetector CT imaging of the head, cervical spine, and maxillofacial structures were performed using the standard protocol without intravenous contrast. Multiplanar CT image reconstructions of the cervical spine and maxillofacial structures were also generated. COMPARISON:  None. FINDINGS: CT HEAD FINDINGS Brain: Ventricles are normal in size and configuration. All areas of the brain demonstrate appropriate gray-white matter attenuation. There is no mass, hemorrhage, edema or other evidence of acute parenchymal abnormality. No extra-axial hemorrhage. Vascular: No hyperdense vessel or unexpected calcification. Skull: Normal. Negative for fracture or focal lesion. Other: None. CT MAXILLOFACIAL FINDINGS Osseous: Lower frontal bones are intact. Nondisplaced, to minimally displaced, fractures of the nasal bones bilaterally. Osseous structures about the orbits are intact and normally aligned bilaterally. Bilateral zygomatic arches and pterygoid plates are intact and  normally aligned. Walls of the maxillary sinuses appear intact and normally aligned. No mandible fracture or displacement seen. Defect within the posterior LEFT mandibular molar, presumably a cavity. Orbits: Negative. No traumatic or inflammatory finding. Sinuses: Clear. Soft tissues: Soft tissue edema overlying the nasal bones. No circumscribed soft tissue hematoma identified. CT CERVICAL SPINE FINDINGS Alignment: Minimal scoliosis which may be accentuated by patient positioning. Slight reversal of the normal cervical spine lordosis is likely related to patient positioning or muscle spasm. No evidence of acute vertebral body subluxation. Skull base and vertebrae: No fracture line or displaced fracture fragment seen. Facet joints are normally aligned. Soft tissues and spinal canal: No prevertebral fluid or swelling. No visible canal hematoma. Disc levels: Mild disc desiccations within the mid and lower cervical spine. No significant central canal stenosis at any level. Upper chest: Negative. Other: None. IMPRESSION: 1. No acute intracranial abnormality. No intracranial mass, hemorrhage or edema. No skull fracture. 2. Nondisplaced, perhaps minimally displaced, fractures of the nasal bones bilaterally. Soft tissue edema overlying the nasal bones. No other facial bone fracture or dislocation seen. 3. No fracture or acute subluxation within the cervical spine. Mild degenerative change within the mid and lower cervical spine. 4. Defect within the posterior LEFT mandibular molar, presumably a large cavity. Electronically Signed   By: Bary Richard M.D.   On: 05-06-19 20:30   Ct Cervical Spine Wo Contrast  Result Date: 05/06/19 CLINICAL DATA:  Seizure, fall, nose bleed. EXAM: CT  HEAD WITHOUT CONTRAST CT MAXILLOFACIAL WITHOUT CONTRAST CT CERVICAL SPINE WITHOUT CONTRAST TECHNIQUE: Multidetector CT imaging of the head, cervical spine, and maxillofacial structures were performed using the standard protocol without  intravenous contrast. Multiplanar CT image reconstructions of the cervical spine and maxillofacial structures were also generated. COMPARISON:  None. FINDINGS: CT HEAD FINDINGS Brain: Ventricles are normal in size and configuration. All areas of the brain demonstrate appropriate gray-white matter attenuation. There is no mass, hemorrhage, edema or other evidence of acute parenchymal abnormality. No extra-axial hemorrhage. Vascular: No hyperdense vessel or unexpected calcification. Skull: Normal. Negative for fracture or focal lesion. Other: None. CT MAXILLOFACIAL FINDINGS Osseous: Lower frontal bones are intact. Nondisplaced, to minimally displaced, fractures of the nasal bones bilaterally. Osseous structures about the orbits are intact and normally aligned bilaterally. Bilateral zygomatic arches and pterygoid plates are intact and normally aligned. Walls of the maxillary sinuses appear intact and normally aligned. No mandible fracture or displacement seen. Defect within the posterior LEFT mandibular molar, presumably a cavity. Orbits: Negative. No traumatic or inflammatory finding. Sinuses: Clear. Soft tissues: Soft tissue edema overlying the nasal bones. No circumscribed soft tissue hematoma identified. CT CERVICAL SPINE FINDINGS Alignment: Minimal scoliosis which may be accentuated by patient positioning. Slight reversal of the normal cervical spine lordosis is likely related to patient positioning or muscle spasm. No evidence of acute vertebral body subluxation. Skull base and vertebrae: No fracture line or displaced fracture fragment seen. Facet joints are normally aligned. Soft tissues and spinal canal: No prevertebral fluid or swelling. No visible canal hematoma. Disc levels: Mild disc desiccations within the mid and lower cervical spine. No significant central canal stenosis at any level. Upper chest: Negative. Other: None. IMPRESSION: 1. No acute intracranial abnormality. No intracranial mass, hemorrhage or  edema. No skull fracture. 2. Nondisplaced, perhaps minimally displaced, fractures of the nasal bones bilaterally. Soft tissue edema overlying the nasal bones. No other facial bone fracture or dislocation seen. 3. No fracture or acute subluxation within the cervical spine. Mild degenerative change within the mid and lower cervical spine. 4. Defect within the posterior LEFT mandibular molar, presumably a large cavity. Electronically Signed   By: Franki Cabot M.D.   On: 2019-05-03 20:30   Mr Brain W And Wo Contrast  Result Date: 04/08/2019 CLINICAL DATA:  Seizure.  Unequal pupils. EXAM: MRI HEAD WITHOUT AND WITH CONTRAST TECHNIQUE: Multiplanar, multiecho pulse sequences of the brain and surrounding structures were obtained without and with intravenous contrast. CONTRAST:  7.5 mL Gadavist COMPARISON:  None. FINDINGS: BRAIN: There is no acute infarct, acute hemorrhage or extra-axial collection. The white matter signal is normal for the patient's age. The cerebral and cerebellar volume are age-appropriate. There is no hydrocephalus. The midline structures are normal. The hippocampi are normal and symmetric in size and signal. The hypothalamus and mamillary bodies are normal. There is no cortical ectopia or dysplasia. No abnormal contrast enhancement. VASCULAR: Susceptibility-sensitive sequences show no chronic microhemorrhage or superficial siderosis. The major intracranial arterial and venous sinus flow voids are normal. SKULL AND UPPER CERVICAL SPINE: Calvarial bone marrow signal is normal. There is no skull base mass. The visualized upper cervical spine and soft tissues are normal. SINUSES/ORBITS: There are no fluid levels or advanced mucosal thickening. The mastoid air cells and middle ear cavities are free of fluid. The orbits are normal. IMPRESSION: Normal brain MRI. Electronically Signed   By: Ulyses Jarred M.D.   On: 04/08/2019 01:27   Ct Maxillofacial Wo Cm  Result Date: 2019/05/03 CLINICAL  DATA:   Seizure, fall, nose bleed. EXAM: CT HEAD WITHOUT CONTRAST CT MAXILLOFACIAL WITHOUT CONTRAST CT CERVICAL SPINE WITHOUT CONTRAST TECHNIQUE: Multidetector CT imaging of the head, cervical spine, and maxillofacial structures were performed using the standard protocol without intravenous contrast. Multiplanar CT image reconstructions of the cervical spine and maxillofacial structures were also generated. COMPARISON:  None. FINDINGS: CT HEAD FINDINGS Brain: Ventricles are normal in size and configuration. All areas of the brain demonstrate appropriate gray-white matter attenuation. There is no mass, hemorrhage, edema or other evidence of acute parenchymal abnormality. No extra-axial hemorrhage. Vascular: No hyperdense vessel or unexpected calcification. Skull: Normal. Negative for fracture or focal lesion. Other: None. CT MAXILLOFACIAL FINDINGS Osseous: Lower frontal bones are intact. Nondisplaced, to minimally displaced, fractures of the nasal bones bilaterally. Osseous structures about the orbits are intact and normally aligned bilaterally. Bilateral zygomatic arches and pterygoid plates are intact and normally aligned. Walls of the maxillary sinuses appear intact and normally aligned. No mandible fracture or displacement seen. Defect within the posterior LEFT mandibular molar, presumably a cavity. Orbits: Negative. No traumatic or inflammatory finding. Sinuses: Clear. Soft tissues: Soft tissue edema overlying the nasal bones. No circumscribed soft tissue hematoma identified. CT CERVICAL SPINE FINDINGS Alignment: Minimal scoliosis which may be accentuated by patient positioning. Slight reversal of the normal cervical spine lordosis is likely related to patient positioning or muscle spasm. No evidence of acute vertebral body subluxation. Skull base and vertebrae: No fracture line or displaced fracture fragment seen. Facet joints are normally aligned. Soft tissues and spinal canal: No prevertebral fluid or swelling. No  visible canal hematoma. Disc levels: Mild disc desiccations within the mid and lower cervical spine. No significant central canal stenosis at any level. Upper chest: Negative. Other: None. IMPRESSION: 1. No acute intracranial abnormality. No intracranial mass, hemorrhage or edema. No skull fracture. 2. Nondisplaced, perhaps minimally displaced, fractures of the nasal bones bilaterally. Soft tissue edema overlying the nasal bones. No other facial bone fracture or dislocation seen. 3. No fracture or acute subluxation within the cervical spine. Mild degenerative change within the mid and lower cervical spine. 4. Defect within the posterior LEFT mandibular molar, presumably a large cavity. Electronically Signed   By: Bary RichardStan  Maynard M.D.   On: 04/11/2019 20:30      Procedures:   Subjective: Patient is feeling better, no further seizures, no confusion or agitation, no tremors.   Discharge Exam: Vitals:   04/08/19 2145 04/09/19 0557  BP: (!) 135/94 121/84  Pulse: (!) 102 72  Resp: 18 18  Temp: 99.1 F (37.3 C) (!) 97.5 F (36.4 C)  SpO2: 96% 96%   Vitals:   04/08/19 0547 04/08/19 1328 04/08/19 2145 04/09/19 0557  BP: (!) 145/91 134/84 (!) 135/94 121/84  Pulse: 82 84 (!) 102 72  Resp: 18 18 18 18   Temp: 98.4 F (36.9 C) 98.4 F (36.9 C) 99.1 F (37.3 C) (!) 97.5 F (36.4 C)  TempSrc: Oral Oral Oral Oral  SpO2: 98% 98% 96% 96%  Weight:      Height:        General: Not in pain or dyspnea  Neurology: Awake and alert, non focal  E ENT: no pallor, no icterus, oral mucosa moist Cardiovascular: No JVD. S1-S2 present, rhythmic, no gallops, rubs, or murmurs. No lower extremity edema. Pulmonary: vesicular breath sounds bilaterally, adequate air movement, no wheezing, rhonchi or rales. Gastrointestinal. Abdomen with no organomegaly, non tender, no rebound or guarding Skin. No rashes Musculoskeletal: no joint deformities  The results of significant diagnostics from this hospitalization  (including imaging, microbiology, ancillary and laboratory) are listed below for reference.     Microbiology: Recent Results (from the past 240 hour(s))  SARS Coronavirus 2 [facility] order, Performed in [facility] hospital lab)     Status: None   Collection Time: 04/08/19  1:16 AM  Result Value Ref Range Status   SARS Coronavirus 2 NEGATIVE NEGATIVE Final    Comment: (NOTE) If result is NEGATIVE SARS-CoV-2 target nucleic acids are NOT DETECTED. The SARS-CoV-2 RNA is generally detectable in upper and lower  respiratory specimens during the acute phase of infection. The lowest  concentration of SARS-CoV-2 viral copies this assay can detect is 250  copies / mL. A negative result does not preclude SARS-CoV-2 infection  and should not be used as the sole basis for treatment or other  patient management decisions.  A negative result may occur with  improper specimen collection / handling, submission of specimen other  than nasopharyngeal swab, presence of viral mutation(s) within the  areas targeted by this assay, and inadequate number of viral copies  (<250 copies / mL). A negative result must be combined with clinical  observations, patient history, and epidemiological information. If result is POSITIVE SARS-CoV-2 target nucleic acids are DETECTED. The SARS-CoV-2 RNA is generally detectable in upper and lower  respiratory specimens dur ing the acute phase of infection.  Positive  results are indicative of active infection with SARS-CoV-2.  Clinical  correlation with patient history and other diagnostic information is  necessary to determine patient infection status.  Positive results do  not rule out bacterial infection or co-infection with other viruses. If result is PRESUMPTIVE POSTIVE SARS-CoV-2 nucleic acids MAY BE PRESENT.   A presumptive positive result was obtained on the submitted specimen  and confirmed on repeat testing.  While 2019 novel coronavirus  (SARS-CoV-2) nucleic  acids may be present in the submitted sample  additional confirmatory testing may be necessary for epidemiological  and / or clinical management purposes  to differentiate between  SARS-CoV-2 and other Sarbecovirus currently known to infect humans.  If clinically indicated additional testing with an alternate test  methodology [id number]) is advised. The SARS-CoV-2 RNA is generally  detectable in upper and lower respiratory sp ecimens during the acute  phase of infection. The expected result is Negative. Fact Sheet for Patients:  BoilerBrush.com.cy Fact Sheet for Healthcare Providers: https://pope.com/ This test is not yet approved or cleared by the [place] FDA and has been authorized for detection and/or diagnosis of SARS-CoV-2 by FDA under an Emergency Use Authorization (EUA).  This EUA will remain in effect (meaning this test can be used) for the duration of the COVID-19 declaration under Section 564(b)(1) of the Act, 21 U.S.C. section 360bbb-3(b)(1), unless the authorization is terminated or revoked sooner. Performed at [facility], [address] [place], [place] [address]      Labs: BNP (last 3 results) No results for input(s): BNP in the last 8760 hours. Basic Metabolic Panel: Recent Labs  Lab 04-13-2019 1258 04-13-19 1917 04/08/19 0309  NA 134*  --  137  K 3.5  --  3.2*  CL 99  --  101  CO2 21*  --  23  GLUCOSE 127*  --  102*  BUN 5*  --  5*  CREATININE 0.67  --  0.53  CALCIUM 9.2  --  9.1  MG  --  1.7  --    Liver Function Tests: Recent  Labs  Lab 04/08/19 0309  AST 54*  ALT 49*  ALKPHOS 62  BILITOT 0.6  PROT 7.4  ALBUMIN 3.9   No results for input(s): LIPASE, AMYLASE in the last 168 hours. No results for input(s): AMMONIA in the last 168 hours. CBC: Recent Labs  Lab 04/03/2019 1258 04/08/19 0309  WBC 7.8 9.7  NEUTROABS 5.7  --   HGB 14.7 13.8  HCT 42.5 41.0  MCV 93.0 94.7  PLT 177 188    Cardiac Enzymes: No results for input(s): CKTOTAL, CKMB, CKMBINDEX, TROPONINI in the last 168 hours. BNP: Invalid input(s): POCBNP CBG: No results for input(s): GLUCAP in the last 168 hours. D-Dimer No results for input(s): DDIMER in the last 72 hours. Hgb A1c No results for input(s): HGBA1C in the last 72 hours. Lipid Profile No results for input(s): CHOL, HDL, LDLCALC, TRIG, CHOLHDL, LDLDIRECT in the last 72 hours. Thyroid function studies No results for input(s): TSH, T4TOTAL, T3FREE, THYROIDAB in the last 72 hours.  Invalid input(s): FREET3 Anemia work up No results for input(s): VITAMINB12, FOLATE, FERRITIN, TIBC, IRON, RETICCTPCT in the last 72 hours. Urinalysis No results found for: COLORURINE, APPEARANCEUR, LABSPEC, PHURINE, GLUCOSEU, HGBUR, BILIRUBINUR, KETONESUR, PROTEINUR, UROBILINOGEN, NITRITE, LEUKOCYTESUR Sepsis Labs Invalid input(s): PROCALCITONIN,  WBC,  LACTICIDVEN Microbiology Recent Results (from the past 240 hour(s))  SARS Coronavirus 2 [facility](Hospital order, Performed in [facility] hospital lab)     Status: None   Collection Time: 04/08/19  1:16 AM  Result Value Ref Range Status   SARS Coronavirus 2 NEGATIVE NEGATIVE Final    Comment: (NOTE) If result is NEGATIVE SARS-CoV-2 target nucleic acids are NOT DETECTED. The SARS-CoV-2 RNA is generally detectable in upper and lower  respiratory specimens during the acute phase of infection. The lowest  concentration of SARS-CoV-2 viral copies this assay can detect is 250  copies / mL. A negative result does not preclude SARS-CoV-2 infection  and should not be used as the sole basis for treatment or other  patient management decisions.  A negative result may occur with  improper specimen collection / handling, submission of specimen other  than nasopharyngeal swab, presence of viral mutation(s) within the  areas targeted by this assay, and inadequate number of viral copies  (<250 copies / mL). A negative result must  be combined with clinical  observations, patient history, and epidemiological information. If result is POSITIVE SARS-CoV-2 target nucleic acids are DETECTED. The SARS-CoV-2 RNA is generally detectable in upper and lower  respiratory specimens dur ing the acute phase of infection.  Positive  results are indicative of active infection with SARS-CoV-2.  Clinical  correlation with patient history and other diagnostic information is  necessary to determine patient infection status.  Positive results do  not rule out bacterial infection or co-infection with other viruses. If result is PRESUMPTIVE POSTIVE SARS-CoV-2 nucleic acids MAY BE PRESENT.   A presumptive positive result was obtained on the submitted specimen  and confirmed on repeat testing.  While 2019 novel coronavirus  (SARS-CoV-2) nucleic acids may be present in the submitted sample  additional confirmatory testing may be necessary for epidemiological  and / or clinical management purposes  to differentiate between  SARS-CoV-2 and other Sarbecovirus currently known to infect humans.  If clinically indicated additional testing with an alternate test  methodology [id number]) is advised. The SARS-CoV-2 RNA is generally  detectable in upper and lower respiratory sp ecimens during the acute  phase of infection. The expected result is Negative. Fact Sheet for Patients:  BoilerBrush.com.cyhttps://www.fda.gov/media/136312/download Fact Sheet for Healthcare Providers: https://pope.com/https://www.fda.gov/media/136313/download This test is not yet approved or cleared by the [place] FDA and has been authorized for detection and/or diagnosis of SARS-CoV-2 by FDA under an Emergency Use Authorization (EUA).  This EUA will remain in effect (meaning this test can be used) for the duration of the COVID-19 declaration under Section 564(b)(1) of the Act, 21 U.S.C. section 360bbb-3(b)(1), unless the authorization is terminated or revoked sooner. Performed at [facility], [address] [place], [place] [address]      Time coordinating discharge: 45 minutes  SIGNED:   Coralie KeensMauricio Daniel Maize Brittingham, MD  [facility] 04/09/2019, 10:41 AM

## 2019-04-25 NOTE — Discharge Instructions (Signed)
You were seen in the emergency department for evaluation of a first-time seizure.  Your CAT scan of your head and neck along with basic lab work.

## 2019-04-25 NOTE — ED Notes (Signed)
Patient called out to the nurses station due to having another seizure. The nurse entered and the patient had her arms stiff and jerking while staring towards the right and drooling. She was jerking so hard she moved the c-collar over her mouth so it was removed. EDP made aware.

## 2019-04-25 NOTE — ED Notes (Signed)
Patient transported to MRI 

## 2019-04-25 NOTE — H&P (Signed)
History and Physical   Samantha BanksCarly J Wolf ZOX:[id number] DOB: 12/10/1980 DOA: 2019/07/01  Referring MD/NP/PA: Dr. Rush Landmarkegeler  PCP: Patient, No Pcp Per   Outpatient Specialists: None  Patient coming from: Home  Chief Complaint: Seizure  HPI: Samantha BanksCarly J Wolf is a 38 y.o. female with medical history significant of seasonal allergies and ADHD as well as anxiety disorder who was brought in after sustaining a seizure at work.  It lasted about 30 to 40 seconds.  Patient failed on her face.  Patient then was brought to the ER where she was found to be stable.  No evidence of post ictal state.  She is a cigarette smoker and she has been smoking lately otherwise no other drugs.  Work-up was being done including head CT when patient had another witnessed seizure in the ER.  Neurology was then consulted who now recommends admission with work-up.  She has been started on Keppra.  Patient denied any prior head injuries.  Denied any family history of seizures or CVA..  ED Course: Temperature is 99.2, blood pressure 139/108, pulse 120 respirate 26 oxygen sat 89% on room air.  Sodium is 134 potassium 3.5 chloride 99 CO2 21 glucose 127 BUN 5 creatinine 0.67 calcium 9.2.  CBC essentially within normal magnesium 1.7.  CT cervical spine and head as well as maxillofacial showed no acute findings.  Patient is being admitted with diagnosis of seizure.  She is only having headaches at this point.  Review of Systems: As per HPI otherwise 10 point review of systems negative.    Past Medical History:  Diagnosis Date   Allergy     No past surgical history on file.   reports that she has been smoking. She has smoked for the past 5.00 years. She does not have any smokeless tobacco history on file. She reports that she does not drink alcohol or use drugs.  No Known Allergies  No family history on file.   Prior to Admission medications   Medication Sig Start Date End Date Taking? Authorizing Provider  acetaminophen  (TYLENOL) 500 MG tablet Take 1,000 mg by mouth every 6 (six) hours as needed for mild pain or headache.   Yes [provider]  ADDERALL XR 20 MG 24 hr capsule Take 20 mg by mouth every morning. 03/08/19  Yes [provider]  ALPRAZolam Prudy Feeler(XANAX) 1 MG tablet Take 1 mg by mouth 3 (three) times daily as needed for anxiety. 03/08/19  Yes [provider]  sertraline (ZOLOFT) 100 MG tablet Take 200 mg by mouth daily. 03/15/19  Yes [provider]  naproxen (NAPROSYN) 500 MG tablet Take 1 tablet (500 mg total) by mouth 2 (two) times daily with a meal. Patient not taking: Reported on 2019/07/01 11/13/15   Ofilia Neaslark, Michael L, PA-C    Physical Exam: Vitals:   01/12/2019 1900 01/12/2019 1945 01/12/2019 2015 01/12/2019 2030  BP: (!) 165/116 (!) 164/117 (!) 153/106 (!) 158/115  Pulse: (!) 118 (!) 103 (!) 105 (!) 106  Resp: (!) 23 15 (!) 26 (!) 22  Temp:      TempSrc:      SpO2: 95% 94% 97% 96%  Weight:      Height:          Constitutional: NAD, very anxious Vitals:   01/12/2019 1900 01/12/2019 1945 01/12/2019 2015 01/12/2019 2030  BP: (!) 165/116 (!) 164/117 (!) 153/106 (!) 158/115  Pulse: (!) 118 (!) 103 (!) 105 (!) 106  Resp: (!) 23 15 (!) 26 (!)  22  Temp:      TempSrc:      SpO2: 95% 94% 97% 96%  Weight:      Height:       Eyes: PERRL, lids and conjunctivae normal ENMT: Mucous membranes are moist. Posterior pharynx clear of any exudate or lesions.Normal dentition.  Neck: normal, supple, no masses, no thyromegaly Respiratory: clear to auscultation bilaterally, no wheezing, no crackles. Normal respiratory effort. No accessory muscle use.  Cardiovascular: Regular rate and rhythm, no murmurs / rubs / gallops. No extremity edema. 2+ pedal pulses. No carotid bruits.  Abdomen: no tenderness, no masses palpated. No hepatosplenomegaly. Bowel sounds positive.  Musculoskeletal: no clubbing / cyanosis. No joint deformity upper and lower extremities. Good ROM, no contractures. Normal  muscle tone.  Skin: no rashes, lesions, ulcers. No induration Neurologic: CN 2-12 grossly intact. Sensation intact, DTR normal. Strength 5/5 in all 4.  Psychiatric: Normal judgment and insight. Alert and oriented x 3. Normal mood.     Labs on Admission: I have personally reviewed following labs and imaging studies  CBC: Recent Labs  Lab 04/21/2019 1258  WBC 7.8  NEUTROABS 5.7  HGB 14.7  HCT 42.5  MCV 93.0  PLT 177   Basic Metabolic Panel: Recent Labs  Lab 04/06/2019 1258 03/29/2019 1917  NA 134*  --   K 3.5  --   CL 99  --   CO2 21*  --   GLUCOSE 127*  --   BUN 5*  --   CREATININE 0.67  --   CALCIUM 9.2  --   MG  --  1.7   GFR: Estimated Creatinine Clearance: 106.2 mL/min (by C-G formula based on SCr of 0.67 mg/dL). Liver Function Tests: No results for input(s): AST, ALT, ALKPHOS, BILITOT, PROT, ALBUMIN in the last 168 hours. No results for input(s): LIPASE, AMYLASE in the last 168 hours. No results for input(s): AMMONIA in the last 168 hours. Coagulation Profile: No results for input(s): INR, PROTIME in the last 168 hours. Cardiac Enzymes: No results for input(s): CKTOTAL, CKMB, CKMBINDEX, TROPONINI in the last 168 hours. BNP (last 3 results) No results for input(s): PROBNP in the last 8760 hours. HbA1C: No results for input(s): HGBA1C in the last 72 hours. CBG: No results for input(s): GLUCAP in the last 168 hours. Lipid Profile: No results for input(s): CHOL, HDL, LDLCALC, TRIG, CHOLHDL, LDLDIRECT in the last 72 hours. Thyroid Function Tests: No results for input(s): TSH, T4TOTAL, FREET4, T3FREE, THYROIDAB in the last 72 hours. Anemia Panel: No results for input(s): VITAMINB12, FOLATE, FERRITIN, TIBC, IRON, RETICCTPCT in the last 72 hours. Urine analysis: No results found for: COLORURINE, APPEARANCEUR, LABSPEC, PHURINE, GLUCOSEU, HGBUR, BILIRUBINUR, KETONESUR, PROTEINUR, UROBILINOGEN, NITRITE, LEUKOCYTESUR Sepsis  Labs: @LABRCNTIP (procalcitonin:4,lacticidven:4) )No results found for this or any previous visit (from the past 240 hour(s)).   Radiological Exams on Admission: Ct Head Wo Contrast  Result Date: 04/21/2019 CLINICAL DATA:  Seizure, fall, nose bleed. EXAM: CT HEAD WITHOUT CONTRAST CT MAXILLOFACIAL WITHOUT CONTRAST CT CERVICAL SPINE WITHOUT CONTRAST TECHNIQUE: Multidetector CT imaging of the head, cervical spine, and maxillofacial structures were performed using the standard protocol without intravenous contrast. Multiplanar CT image reconstructions of the cervical spine and maxillofacial structures were also generated. COMPARISON:  None. FINDINGS: CT HEAD FINDINGS Brain: Ventricles are normal in size and configuration. All areas of the brain demonstrate appropriate gray-white matter attenuation. There is no mass, hemorrhage, edema or other evidence of acute parenchymal abnormality. No extra-axial hemorrhage. Vascular: No hyperdense vessel or unexpected calcification.  Skull: Normal. Negative for fracture or focal lesion. Other: None. CT MAXILLOFACIAL FINDINGS Osseous: Lower frontal bones are intact. Nondisplaced, to minimally displaced, fractures of the nasal bones bilaterally. Osseous structures about the orbits are intact and normally aligned bilaterally. Bilateral zygomatic arches and pterygoid plates are intact and normally aligned. Walls of the maxillary sinuses appear intact and normally aligned. No mandible fracture or displacement seen. Defect within the posterior LEFT mandibular molar, presumably a cavity. Orbits: Negative. No traumatic or inflammatory finding. Sinuses: Clear. Soft tissues: Soft tissue edema overlying the nasal bones. No circumscribed soft tissue hematoma identified. CT CERVICAL SPINE FINDINGS Alignment: Minimal scoliosis which may be accentuated by patient positioning. Slight reversal of the normal cervical spine lordosis is likely related to patient positioning or muscle spasm. No  evidence of acute vertebral body subluxation. Skull base and vertebrae: No fracture line or displaced fracture fragment seen. Facet joints are normally aligned. Soft tissues and spinal canal: No prevertebral fluid or swelling. No visible canal hematoma. Disc levels: Mild disc desiccations within the mid and lower cervical spine. No significant central canal stenosis at any level. Upper chest: Negative. Other: None. IMPRESSION: 1. No acute intracranial abnormality. No intracranial mass, hemorrhage or edema. No skull fracture. 2. Nondisplaced, perhaps minimally displaced, fractures of the nasal bones bilaterally. Soft tissue edema overlying the nasal bones. No other facial bone fracture or dislocation seen. 3. No fracture or acute subluxation within the cervical spine. Mild degenerative change within the mid and lower cervical spine. 4. Defect within the posterior LEFT mandibular molar, presumably a large cavity. Electronically Signed   By: Franki Cabot M.D.   On: 2019-04-11 20:30   Ct Cervical Spine Wo Contrast  Result Date: 04/11/19 CLINICAL DATA:  Seizure, fall, nose bleed. EXAM: CT HEAD WITHOUT CONTRAST CT MAXILLOFACIAL WITHOUT CONTRAST CT CERVICAL SPINE WITHOUT CONTRAST TECHNIQUE: Multidetector CT imaging of the head, cervical spine, and maxillofacial structures were performed using the standard protocol without intravenous contrast. Multiplanar CT image reconstructions of the cervical spine and maxillofacial structures were also generated. COMPARISON:  None. FINDINGS: CT HEAD FINDINGS Brain: Ventricles are normal in size and configuration. All areas of the brain demonstrate appropriate gray-white matter attenuation. There is no mass, hemorrhage, edema or other evidence of acute parenchymal abnormality. No extra-axial hemorrhage. Vascular: No hyperdense vessel or unexpected calcification. Skull: Normal. Negative for fracture or focal lesion. Other: None. CT MAXILLOFACIAL FINDINGS Osseous: Lower frontal  bones are intact. Nondisplaced, to minimally displaced, fractures of the nasal bones bilaterally. Osseous structures about the orbits are intact and normally aligned bilaterally. Bilateral zygomatic arches and pterygoid plates are intact and normally aligned. Walls of the maxillary sinuses appear intact and normally aligned. No mandible fracture or displacement seen. Defect within the posterior LEFT mandibular molar, presumably a cavity. Orbits: Negative. No traumatic or inflammatory finding. Sinuses: Clear. Soft tissues: Soft tissue edema overlying the nasal bones. No circumscribed soft tissue hematoma identified. CT CERVICAL SPINE FINDINGS Alignment: Minimal scoliosis which may be accentuated by patient positioning. Slight reversal of the normal cervical spine lordosis is likely related to patient positioning or muscle spasm. No evidence of acute vertebral body subluxation. Skull base and vertebrae: No fracture line or displaced fracture fragment seen. Facet joints are normally aligned. Soft tissues and spinal canal: No prevertebral fluid or swelling. No visible canal hematoma. Disc levels: Mild disc desiccations within the mid and lower cervical spine. No significant central canal stenosis at any level. Upper chest: Negative. Other: None. IMPRESSION: 1. No acute intracranial abnormality.  No intracranial mass, hemorrhage or edema. No skull fracture. 2. Nondisplaced, perhaps minimally displaced, fractures of the nasal bones bilaterally. Soft tissue edema overlying the nasal bones. No other facial bone fracture or dislocation seen. 3. No fracture or acute subluxation within the cervical spine. Mild degenerative change within the mid and lower cervical spine. 4. Defect within the posterior LEFT mandibular molar, presumably a large cavity. Electronically Signed   By: Bary RichardStan  Maynard M.D.   On: 11/02/18 20:30   Ct Maxillofacial Wo Cm  Result Date: 03/18/2019 CLINICAL DATA:  Seizure, fall, nose bleed. EXAM: CT HEAD  WITHOUT CONTRAST CT MAXILLOFACIAL WITHOUT CONTRAST CT CERVICAL SPINE WITHOUT CONTRAST TECHNIQUE: Multidetector CT imaging of the head, cervical spine, and maxillofacial structures were performed using the standard protocol without intravenous contrast. Multiplanar CT image reconstructions of the cervical spine and maxillofacial structures were also generated. COMPARISON:  None. FINDINGS: CT HEAD FINDINGS Brain: Ventricles are normal in size and configuration. All areas of the brain demonstrate appropriate gray-white matter attenuation. There is no mass, hemorrhage, edema or other evidence of acute parenchymal abnormality. No extra-axial hemorrhage. Vascular: No hyperdense vessel or unexpected calcification. Skull: Normal. Negative for fracture or focal lesion. Other: None. CT MAXILLOFACIAL FINDINGS Osseous: Lower frontal bones are intact. Nondisplaced, to minimally displaced, fractures of the nasal bones bilaterally. Osseous structures about the orbits are intact and normally aligned bilaterally. Bilateral zygomatic arches and pterygoid plates are intact and normally aligned. Walls of the maxillary sinuses appear intact and normally aligned. No mandible fracture or displacement seen. Defect within the posterior LEFT mandibular molar, presumably a cavity. Orbits: Negative. No traumatic or inflammatory finding. Sinuses: Clear. Soft tissues: Soft tissue edema overlying the nasal bones. No circumscribed soft tissue hematoma identified. CT CERVICAL SPINE FINDINGS Alignment: Minimal scoliosis which may be accentuated by patient positioning. Slight reversal of the normal cervical spine lordosis is likely related to patient positioning or muscle spasm. No evidence of acute vertebral body subluxation. Skull base and vertebrae: No fracture line or displaced fracture fragment seen. Facet joints are normally aligned. Soft tissues and spinal canal: No prevertebral fluid or swelling. No visible canal hematoma. Disc levels: Mild  disc desiccations within the mid and lower cervical spine. No significant central canal stenosis at any level. Upper chest: Negative. Other: None. IMPRESSION: 1. No acute intracranial abnormality. No intracranial mass, hemorrhage or edema. No skull fracture. 2. Nondisplaced, perhaps minimally displaced, fractures of the nasal bones bilaterally. Soft tissue edema overlying the nasal bones. No other facial bone fracture or dislocation seen. 3. No fracture or acute subluxation within the cervical spine. Mild degenerative change within the mid and lower cervical spine. 4. Defect within the posterior LEFT mandibular molar, presumably a large cavity. Electronically Signed   By: Bary RichardStan  Maynard M.D.   On: 11/02/18 20:30    Assessment/Plan Principal Problem:   Seizure disorder [facility](HCC) Active Problems:   Hyponatremia     #1 seizure: Initial seizure in the moment.  Cause not entirely clear.  Neurology consulted and will will follow recommendation.  I will check drug screening well continue with initial Keppra.  Follow MRI and EEG results.  #2 mild hyponatremia: Continue IV saline and monitor sodium.  #3 tobacco abuse: Counseling provided.  Initiate nicotine patch  #4 anxiety state: Empiric treatment.  #5 elevated blood pressure: No prior history of hypertension.  Monitor blood pressure closely.   DVT prophylaxis: SCD Code Status: Full code Family Communication: No family at bedside Disposition Plan: Home Consults called: Dr. Otelia LimesLindzen of  neurology Admission status: Observation  Severity of Illness: The appropriate patient status for this patient is OBSERVATION. Observation status is judged to be reasonable and necessary in order to provide the required intensity of service to ensure the patient's safety. The patient's presenting symptoms, physical exam findings, and initial radiographic and laboratory data in the context of their medical condition is felt to place them at decreased risk for further  clinical deterioration. Furthermore, it is anticipated that the patient will be medically stable for discharge from the hospital within 2 midnights of admission. The following factors support the patient status of observation.   " The patient's presenting symptoms include seizure. " The physical exam findings include no significant findings. " The initial radiographic and laboratory data are normal findings.     Lonia BloodGARBA,LAWAL MD [facility] Pager [phone]  If 7PM-7AM, please contact night-coverage [email or web address] Password [email or web address]  Jan 10, 2019, 9:35 PM

## 2019-04-25 NOTE — ED Provider Notes (Signed)
[place] EMERGENCY DEPARTMENT Provider Note   CSN: [id number] Arrival date & time: 04/08/19  1252     History   Chief Complaint Chief Complaint  Patient presents with  . Seizures    HPI Samantha Wolf is a 38 y.o. female.  She is brought in by EMS after a likely seizure at work today.  She says she has no medical problems and has not had a seizure before.  She said she was working and had no complaints and then woke up with bleeding from her nose on the floor.  EMS said that coworkers noticed about 60 seconds of tonic-clonic activity after she fell on her face.  Patient complaining of a moderate throbbing headache but otherwise had no preceding illness symptoms.  She has been eating and drinking well.  She denies any alcohol or drugs.  She has as needed Xanax that she uses for sleep at night.  No recent head injuries.     The history is provided by the patient and the EMS personnel.  Seizures Seizure activity on arrival: no   Seizure type:  Grand mal Preceding symptoms: headache   Initial focality:  Unable to specify Episode characteristics: generalized shaking   Postictal symptoms: confusion   Return to baseline: yes   Severity:  Unable to specify Duration:  60 seconds Timing:  Once Number of seizures this episode:  1 Progression:  Resolved Context: not alcohol withdrawal, not change in medication, not sleeping less, not drug use, not family hx of seizures, not fever and not previous head injury   Recent head injury:  No recent head injuries PTA treatment:  None History of seizures: no     Past Medical History:  Diagnosis Date  . Allergy     There are no active problems to display for this patient.   No past surgical history on file.   OB History   No obstetric history on file.      Home Medications    Prior to Admission medications   Medication Sig Start Date End Date Taking? Authorizing Provider  naproxen (NAPROSYN) 500 MG tablet Take  1 tablet (500 mg total) by mouth 2 (two) times daily with a meal. 11/13/15   Tereasa Coop, PA-C    Family History No family history on file.  Social History Social History   Tobacco Use  . Smoking status: Current Every Day Smoker    Years: 5.00  Substance Use Topics  . Alcohol use: No    Alcohol/week: 0.0 standard drinks  . Drug use: No     Allergies   Patient has no known allergies.   Review of Systems Review of Systems  Constitutional: Negative for fever.  HENT: Positive for nosebleeds. Negative for sore throat.   Eyes: Negative for visual disturbance.  Respiratory: Negative for shortness of breath.   Cardiovascular: Negative for chest pain.  Gastrointestinal: Negative for abdominal pain.  Genitourinary: Negative for dysuria.  Musculoskeletal: Negative for neck pain.  Skin: Negative for rash.  Neurological: Positive for seizures and headaches.     Physical Exam Updated Vital Signs BP (!) 145/91 (BP Location: Right Arm)   Pulse 82   Temp 98.4 F (36.9 C) (Oral)   Resp 18   Ht 5\' 8"  (1.727 m)   Wt 79.6 kg   SpO2 98%   BMI 26.68 kg/m   Physical Exam Vitals signs and nursing note reviewed.  Constitutional:      General: She is not  in acute distress.    Appearance: She is well-developed.  HENT:     Head: Normocephalic.     Nose:     Comments: She is swollen and tender nose.  She has some blood in her nares.  There is no active bleeding. Eyes:     Conjunctiva/sclera: Conjunctivae normal.  Neck:     Musculoskeletal: Neck supple.  Cardiovascular:     Rate and Rhythm: Normal rate and regular rhythm.     Heart sounds: No murmur.  Pulmonary:     Effort: Pulmonary effort is normal. No respiratory distress.     Breath sounds: Normal breath sounds.  Abdominal:     Palpations: Abdomen is soft.     Tenderness: There is no abdominal tenderness.  Musculoskeletal: Normal range of motion.        General: No tenderness.     Right lower leg: No edema.      Left lower leg: No edema.  Skin:    General: Skin is warm and dry.     Capillary Refill: Capillary refill takes less than 2 seconds.  Neurological:     General: No focal deficit present.     Mental Status: She is alert and oriented to person, place, and time.     Sensory: No sensory deficit.     Motor: No weakness.      ED Treatments / Results  Labs (all labs ordered are listed, but only abnormal results are displayed) Labs Reviewed  BASIC METABOLIC PANEL - Abnormal; Notable for the following components:      Result Value   Sodium 134 (*)    CO2 21 (*)    Glucose, Bld 127 (*)    BUN 5 (*)    All other components within normal limits  COMPREHENSIVE METABOLIC PANEL - Abnormal; Notable for the following components:   Potassium 3.2 (*)    Glucose, Bld 102 (*)    BUN 5 (*)    AST 54 (*)    ALT 49 (*)    All other components within normal limits  SARS CORONAVIRUS 2 (HOSPITAL ORDER, PERFORMED IN [facility])  CBC WITH DIFFERENTIAL/PLATELET  MAGNESIUM  CBC  HIV ANTIBODY (ROUTINE TESTING W REFLEX)  I-STAT BETA HCG BLOOD, ED ([facility], [facility], [facility] ONLY)    EKG EKG Interpretation  Date/Time:  Friday April 07 2019 12:54:29 EDT Ventricular Rate:  106 PR Interval:    QRS Duration: 103 QT Interval:  353 QTC Calculation: 469 R Axis:   71 Text Interpretation:  Sinus tachycardia Probable left atrial enlargement No old tracing to compare Confirmed by Meridee ScoreButler,  [id number]) on 04/19/2019 12:59:59 PM   Radiology No results found.  Procedures Procedures (including critical care time)  Medications Ordered in ED Medications - No data to display   Initial Impression / Assessment and Plan / ED Course  I have reviewed the triage vital signs and the nursing notes.  Pertinent labs & imaging results that were available during my care of the patient were reviewed by me and considered in my medical decision making (see chart for details).  Clinical Course as of Apr 08 811  Fri  Apr 07, 2019  1303 Patient with no prior history of seizure disorder here with a tonic-clonic seizure.  She is back to baseline from mental status standpoint although has some signs of some facial trauma.  She is getting some screening labs and CAT scan of her head face and neck.   [MB]  1459  Discussed with Dr. Wilford CornerArora from neurology.  He said to give him a call back after the CTs are done but [facility]   [MB]    Clinical Course User Index [MB] Terrilee FilesButler,  C, MD      Signed out to Dr Julieanne Mansonegler with CTs pending. Dispo per ct results and discussion with neurology.   Final Clinical Impressions(s) / ED Diagnoses   Final diagnoses:  New onset seizure (HCC)  Seizures [facility](HCC)    ED Discharge Orders    None       Terrilee FilesButler,  C, MD 04/08/19 [phone]

## 2019-04-25 NOTE — ED Triage Notes (Signed)
[facility] reports that the patient had a witnessed seizure at work at [employer]. The seizure lasted about 30-40 seconds. She fell face down and had a nosebleed when EMS arrived. Nose is not currently bleeding. Has a hx of ADHD and anxiety. Currently alert and oriented X4. Last vitals: 179/119, 18-respirations, 96% on RA and CBG-135.

## 2019-04-25 NOTE — Consult Note (Signed)
NEURO HOSPITALIST CONSULT NOTE   Requestig physician: Dr. Rush Landmarkegeler  Reason for Consult: New onset seizures  History obtained from:   Patient and Chart     HPI:                                                                                                                                          Samantha Wolf is an 38 y.o. female who presented via EMS after having a seizure at work at [employer]. Total duration of the seizure was about 30-40 seconds. Immediately prior to visibly seizing, she fell face down resulting in trauma to her upper lip as well as a nosebleed. She has been hypertensive in the ED.   The patient endorses that she works as a Leisure centre managerbartender and generally drinks about 5 standard sized drinks per day. She states that she decided to quit recently and that her last drink was on Wednesday.   She states that she has had one prior seizure in her lifetime, occurring over 10 years ago. She is unable to specify if it was due to EtOH withdrawal or not.   EKG: Sinus tachycardia Probable left atrial enlargement  Past Medical History:  Diagnosis Date  . Allergy     No past surgical history on file.  No family history on file.            Social History:  reports that she has been smoking. She has smoked for the past 5.00 years. She does not have any smokeless tobacco history on file. She reports that she does not drink alcohol or use drugs.  No Known Allergies  HOME MEDICATIONS:                                                                                                                        ROS:  No fever, CP, SOB, abdominal pain, or dysuria. Has headache and c/o nosebleeds. ROS otherwise as per HPI with all other systems negative.  Blood pressure (!) 153/106, pulse (!) 106, temperature 99.2 F (37.3 C), temperature  source Oral, resp. rate 19, height 5\' 8"  (1.727 m), weight 77.1 kg, SpO2 92 %.   General Examination:                                                                                                       Physical Exam  HEENT-  [name]. Evidence for trauma to upper lip noted.    Lungs- Respirations unlabored Extremities- No edema.   Neurological Examination Mental Status: Alert, fully oriented, thought content appropriate.  Speech fluent without evidence of aphasia.  Able to follow all commands without difficulty. Cranial Nerves: II: Visual fields intact with no extinction to DSS. Large asymmetric pupils noted: Left pupil 6 mm and reactive, right pupil 4 mm and reactive.  III,IV, VI: EOMI with mild esotropia noted. No nystagmus. No ptosis.  V,VII: Face symmetric, facial temp sensation equal bilaterally VIII: hearing intact to conversation IX,X: Palate rises symmetrically XI: Symmetric XII: midline tongue extension Motor: Right : Upper extremity   5/5    Left:     Upper extremity   5/5  Lower extremity   5/5     Lower extremity   5/5 No pronator drift No jerking, twitching, posturing or other adventitious movements noted Sensory: Temp and light touch intact throughout, bilaterally. No extinction.  Deep Tendon Reflexes:  2+ right biceps, brachioradialis and patella.   3+ left biceps, brachioradialis and patella.  Plantars: Right: downgoing   Left: downgoing Cerebellar: No ataxia with FNF bilaterally  Gait: Deferred   Lab Results: Basic Metabolic Panel: Recent Labs  Lab November 12, 2018 1258  NA 134*  K 3.5  CL 99  CO2 21*  GLUCOSE 127*  BUN 5*  CREATININE 0.67  CALCIUM 9.2    CBC: Recent Labs  Lab November 12, 2018 1258  WBC 7.8  NEUTROABS 5.7  HGB 14.7  HCT 42.5  MCV 93.0  PLT 177    Cardiac Enzymes: No results for input(s): CKTOTAL, CKMB, CKMBINDEX, TROPONINI in the last 168 hours.  Lipid Panel: No results for input(s): CHOL, TRIG, HDL, CHOLHDL, VLDL, LDLCALC in the last 168  hours.  Imaging: No results found.   Assessment: 38 year old female with seizures x 2. Overall presentation is most consistent with EtOH withdrawal seizure.  1. Exam shows mild hyperreflexia on the left and dilated asymmetric pupils. No motor weakness noted. No clinical seizure activity appreciated.  2. CT head shows no acute intracranial abnormality.  3. Bilateral nasal bone fractures are also noted on CT.    Recommendations: 1. MRI brain with and without contrast 2. EEG.  3. Inpatient seizure precautions.  4. Outpatient seizure precautions: [place] [facility] [place], patients with seizures are not allowed to drive until  they have been seizure-free for six months. Use caution when using heavy equipment or power tools. Avoid working on ladders or at heights. Take showers instead of  baths. Ensure the water temperature is not too high on the home water heater. Do not go swimming alone. When caring for infants or small children, sit down when holding, feeding, or changing them to minimize risk of injury to the child in the event you have a seizure. Also, Maintain good sleep hygiene. Avoid alcohol. 5. Discontinue Adderall. This medication can lower the seizure threshold.  6. Scheduled Ativan 2 mg po or IV q6h x 4 doses, then 1 mg po or IV q6h x 8 doses, then PRN with CIWA protocol 7. Magnesium level 8. Load with Keppra 1000 mg IV x 1. Given EtOH withdrawal as the most likely etiology for her seizures, hold off on scheduled Keppra unless an indication is revealed by MRI or EEG results.      Electronically signed: Dr. Kerney Elbe 03/31/2019, 7:10 PM

## 2019-04-25 NOTE — ED Provider Notes (Signed)
Care assumed from Dr. Melina Copa while waiting for results of CT imaging after her new onset seizure.  While awaiting results of CT head, patient had another seizure.  She sees for proximally 30 to 40 seconds per nursing with eye deviation and tonic-clonic shaking.  She was postictal after.  After having a second seizure, neurology is recommending MRI with and without contrast and admission for EEG and further work-up.  Patient agrees with this plan of care.   Clinical Impression: 1. New onset seizure ([place])   2. Seizures ([place])     Disposition: Admit  This note was prepared with assistance of Dragon voice recognition software. Occasional wrong-word or sound-a-like substitutions may have occurred due to the inherent limitations of voice recognition software.     Lovey Crupi, Gwenyth Allegra, MD 04/02/2019 [phone]

## 2019-04-25 DEATH — deceased

## 2020-02-07 IMAGING — CT CT MAXILLOFACIAL WITHOUT CONTRAST
5 of 14 series · 14 of 47 positions shown, 15 images · non-contrast
Comparison: None.

CLINICAL DATA: Seizure, fall, nose bleed.

EXAM:
CT HEAD WITHOUT CONTRAST
CT MAXILLOFACIAL WITHOUT CONTRAST
CT CERVICAL SPINE WITHOUT CONTRAST
TECHNIQUE: Multidetector CT imaging of the head, cervical spine, and
maxillofacial structures were performed using the standard protocol
without intravenous contrast. Multiplanar CT image reconstructions
of the cervical spine and maxillofacial structures were also
generated.

[Series 5: head bone · axial · 0.44mm/px · z∈[-68,+34]mm · 4 of 86 slices shown, 5 images]
[im 18/86  brain]
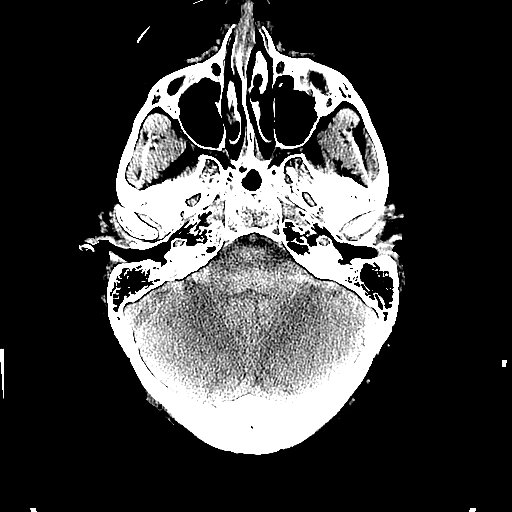
[im 18/86  bone]
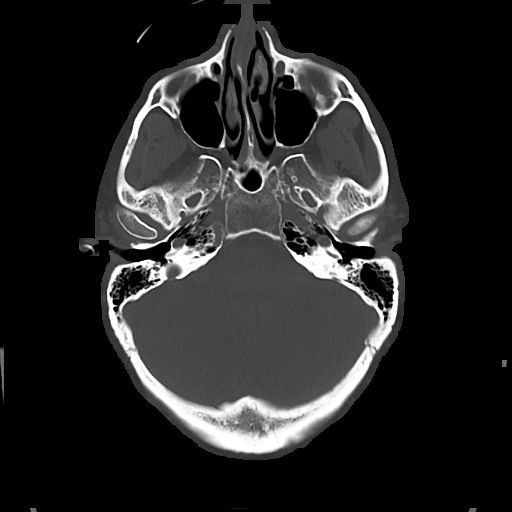
[im 35/86  bone]
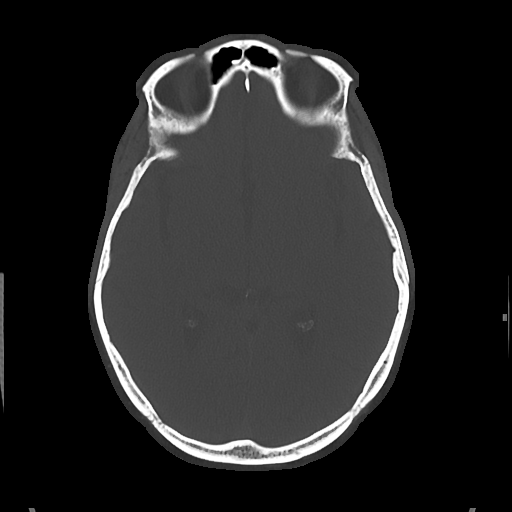
[im 52/86  bone]
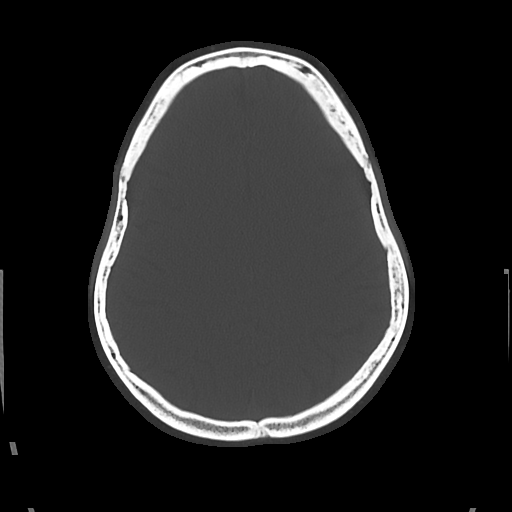
[im 69/86  bone]
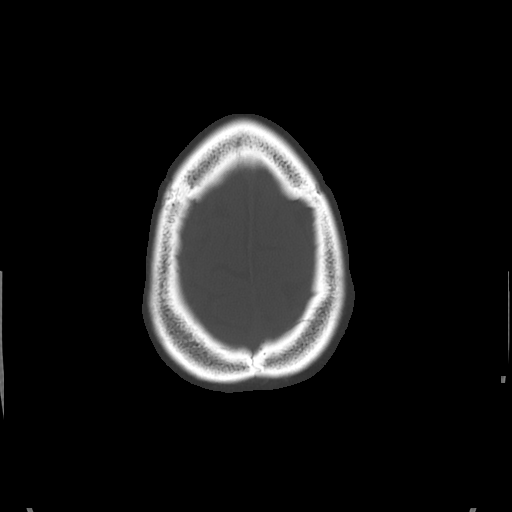

[Series 8: maxilllofacial 2.0 hr40 3 · axial · 0.41mm/px · z∈[-124,-40]mm · 3 of 85 slices shown]
[im 22/85  bone]
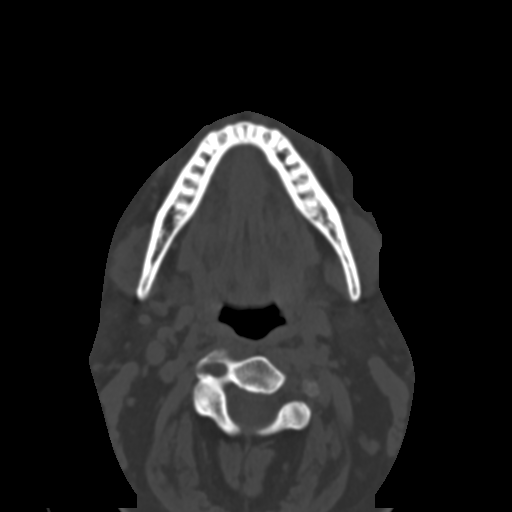
[im 43/85  bone]
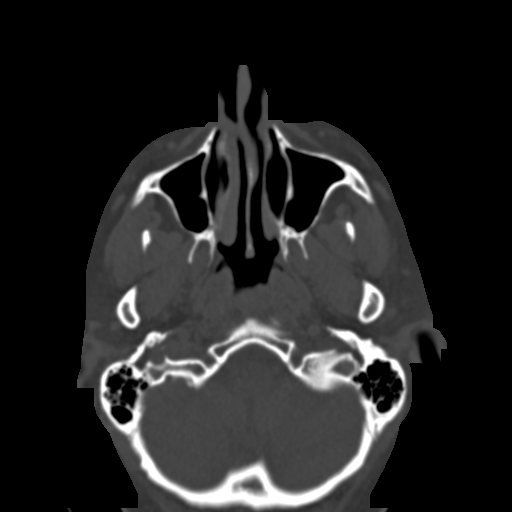
[im 64/85  bone]
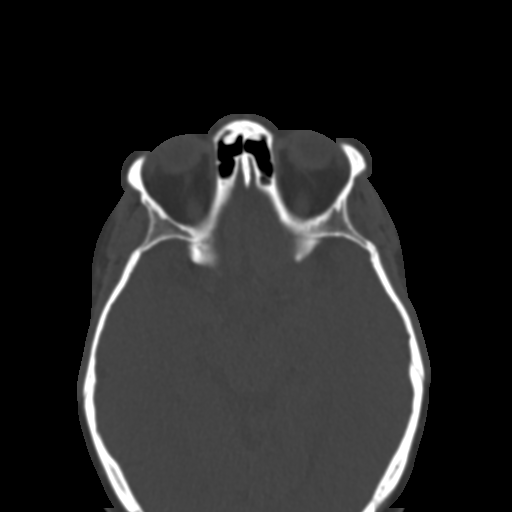

[Series 10: maxilllofacial 2.0 hr59 3 · axial · 0.41mm/px · z∈[-124,-40]mm · 3 of 85 slices shown]
[im 22/85  bone]
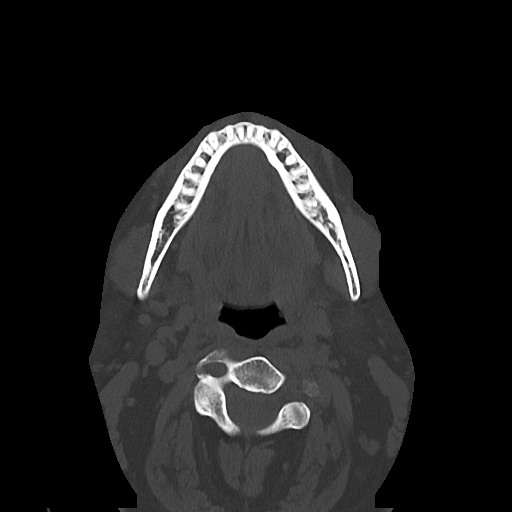
[im 43/85  bone]
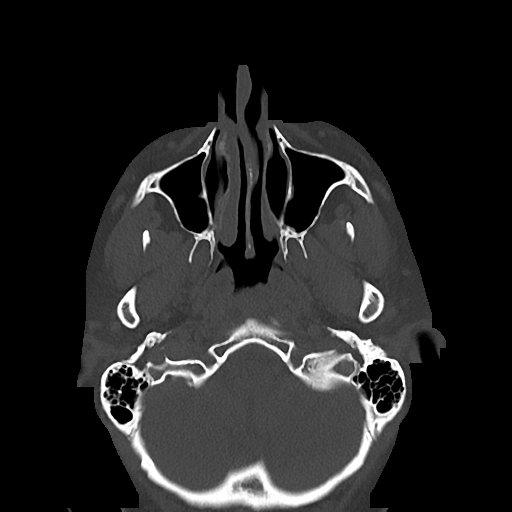
[im 64/85  bone]
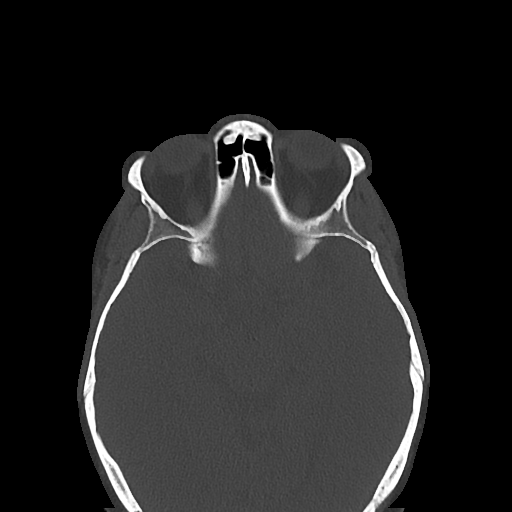

[Series 14: bone cor · coronal · 0.33mm/px · 1 of 86 slices shown]
[im 43/86  bone]
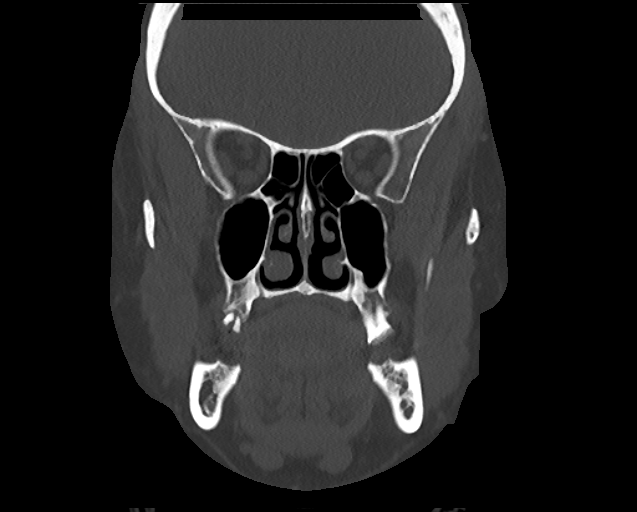

[Series 20: orthogonal axials · axial · 0.21mm/px · z∈[-211,-144]mm · 3 of 85 slices shown]
[im 22/85  bone]
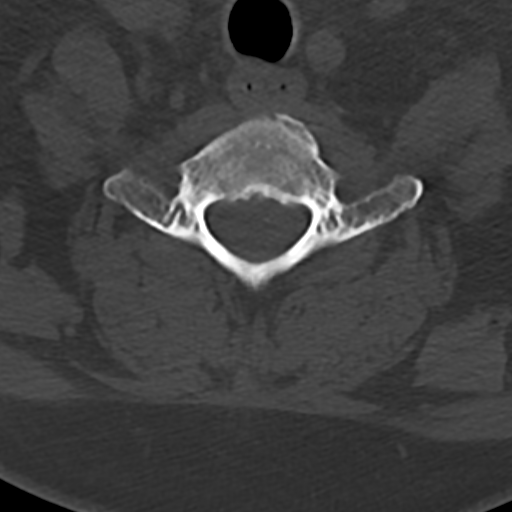
[im 43/85  bone]
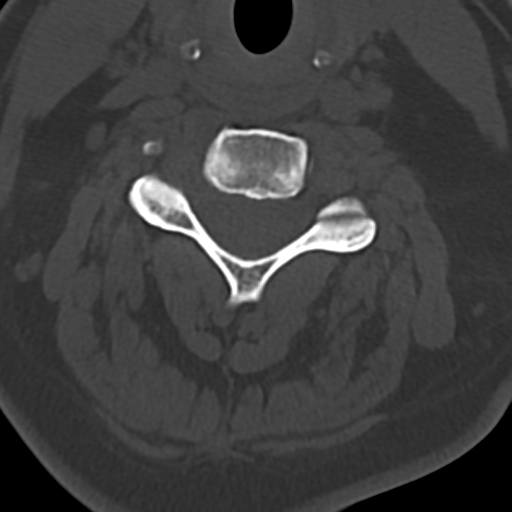
[im 64/85  bone]
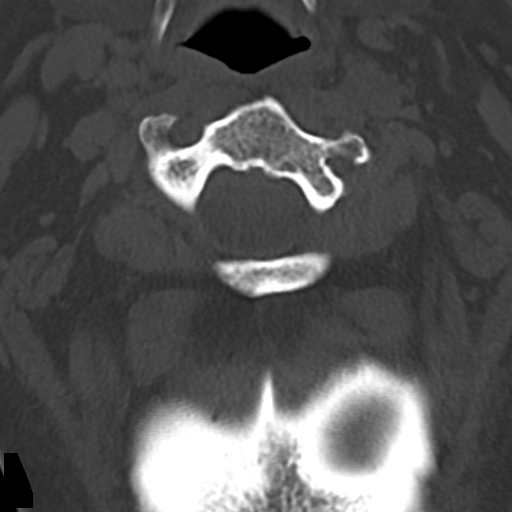

[14 of 47 positions shown; findings below may reference images not displayed]

FINDINGS: CT HEAD FINDINGS

Brain: Ventricles are normal in size and configuration. All areas of
the brain demonstrate appropriate gray-white matter attenuation.
There is no mass, hemorrhage, edema or other evidence of acute
parenchymal abnormality. No extra-axial hemorrhage.

Vascular: No hyperdense vessel or unexpected calcification.

Skull: Normal. Negative for fracture or focal lesion.

Other: None.

CT MAXILLOFACIAL FINDINGS

Osseous: Lower frontal bones are intact. Nondisplaced, to minimally
displaced, fractures of the nasal bones bilaterally. Osseous
structures about the orbits are intact and normally aligned
bilaterally. Bilateral zygomatic arches and pterygoid plates are
intact and normally aligned. Walls of the maxillary sinuses appear
intact and normally aligned. No mandible fracture or displacement
seen.

Defect within the posterior LEFT mandibular molar, presumably a
cavity.

Orbits: Negative. No traumatic or inflammatory finding.

Sinuses: Clear.

Soft tissues: Soft tissue edema overlying the nasal bones. No
circumscribed soft tissue hematoma identified.

CT CERVICAL SPINE FINDINGS

Alignment: Minimal scoliosis which may be accentuated by patient
positioning. Slight reversal of the normal cervical spine lordosis
is likely related to patient positioning or muscle spasm. No
evidence of acute vertebral body subluxation.

Skull base and vertebrae: No fracture line or displaced fracture
fragment seen. Facet joints are normally aligned.

Soft tissues and spinal canal: No prevertebral fluid or swelling. No
visible canal hematoma.

Disc levels: Mild disc desiccations within the mid and lower
cervical spine. No significant central canal stenosis at any level.

Upper chest: Negative.

Other: None.
IMPRESSION: 1. No acute intracranial abnormality. No intracranial mass,
hemorrhage or edema. No skull fracture.
2. Nondisplaced, perhaps minimally displaced, fractures of the nasal
bones bilaterally. Soft tissue edema overlying the nasal bones. No
other facial bone fracture or dislocation seen.
3. No fracture or acute subluxation within the cervical spine. Mild
degenerative change within the mid and lower cervical spine.
4. Defect within the posterior LEFT mandibular molar, presumably a
large cavity.
